# Patient Record
Sex: Female | Born: 1950
Health system: Southern US, Community
[De-identification: ages and names within clinical notes are randomized; demographics above are authoritative.]

## PROBLEM LIST (undated history)

## (undated) DIAGNOSIS — N952 Postmenopausal atrophic vaginitis: Secondary | ICD-10-CM

## (undated) DIAGNOSIS — E663 Overweight: Secondary | ICD-10-CM

## (undated) DIAGNOSIS — R112 Nausea with vomiting, unspecified: Secondary | ICD-10-CM

## (undated) DIAGNOSIS — Z780 Asymptomatic menopausal state: Secondary | ICD-10-CM

## (undated) DIAGNOSIS — J329 Chronic sinusitis, unspecified: Secondary | ICD-10-CM

## (undated) DIAGNOSIS — I10 Essential (primary) hypertension: Secondary | ICD-10-CM

## (undated) DIAGNOSIS — Z9889 Other specified postprocedural states: Secondary | ICD-10-CM

## (undated) HISTORY — DX: Postmenopausal atrophic vaginitis: N95.2

## (undated) HISTORY — DX: Chronic sinusitis, unspecified: J32.9

## (undated) HISTORY — DX: Asymptomatic menopausal state: Z78.0

## (undated) HISTORY — PX: TONSILLECTOMY AND ADENOIDECTOMY: SUR1326

## (undated) HISTORY — DX: Overweight: E66.3

---

## 1989-08-25 HISTORY — PX: TOTAL ABDOMINAL HYSTERECTOMY W/ BILATERAL SALPINGOOPHORECTOMY: SHX83

## 2009-07-06 LAB — HM PAP SMEAR: HM Pap smear: NORMAL

## 2010-03-07 ENCOUNTER — Ambulatory Visit: Payer: Self-pay | Admitting: Gastroenterology

## 2010-06-02 LAB — HM COLONOSCOPY: HM Colonoscopy: NORMAL

## 2011-09-08 ENCOUNTER — Encounter: Payer: Self-pay | Admitting: Internal Medicine

## 2011-09-08 ENCOUNTER — Ambulatory Visit (INDEPENDENT_AMBULATORY_CARE_PROVIDER_SITE_OTHER): Payer: BC Managed Care – PPO | Admitting: Internal Medicine

## 2011-09-08 ENCOUNTER — Ambulatory Visit: Payer: Self-pay | Admitting: Internal Medicine

## 2011-09-08 VITALS — BP 136/82 | HR 73 | Temp 98.1°F | Wt 187.0 lb

## 2011-09-08 DIAGNOSIS — J329 Chronic sinusitis, unspecified: Secondary | ICD-10-CM

## 2011-09-08 MED ORDER — AMOXICILLIN-POT CLAVULANATE 875-125 MG PO TABS: 1.0000 | ORAL_TABLET | Freq: Two times a day (BID) | ORAL | Status: AC

## 2011-09-08 NOTE — Progress Notes (Signed)
  Subjective:    Patient ID: Karen Vaughn, female    DOB: 07-09-1951, 61 y.o.   MRN: [id number]  HPI 61 year old female presents for acute visit complaining of a one-week history of nasal congestion and sinus pressure. She reports that her symptoms first began with fever, chills, malaise, myalgia. These symptoms resolved and she developed gradually worsening nasal congestion and sinus pressure. She describes facial pain across her anterior cheeks and forehead. She has been taking over-the-counter Sudafed and ibuprofen with no improvement.   Review of Systems  Constitutional: Positive for fever, chills and fatigue. Negative for unexpected weight change.  HENT: Positive for congestion, rhinorrhea and sinus pressure. Negative for hearing loss, ear pain, nosebleeds, sore throat, facial swelling, sneezing, mouth sores, trouble swallowing, neck pain, neck stiffness, voice change, postnasal drip, tinnitus and ear discharge.   Eyes: Negative for pain, discharge, redness and visual disturbance.  Respiratory: Positive for cough. Negative for chest tightness, shortness of breath, wheezing and stridor.   Cardiovascular: Negative for chest pain, palpitations and leg swelling.  Musculoskeletal: Negative for myalgias and arthralgias.  Skin: Negative for color change and rash.  Neurological: Negative for dizziness, weakness, light-headedness and headaches.  Hematological: Negative for adenopathy.       Objective:   Physical Exam  Constitutional: She is oriented to person, place, and time. She appears well-developed and well-nourished. No distress.  HENT:  Head: Normocephalic and atraumatic.  Right Ear: External ear normal. A middle ear effusion is present.  Left Ear: External ear normal. A middle ear effusion is present.  Nose: Mucosal edema present.  Mouth/Throat: Oropharynx is clear and moist. No oropharyngeal exudate.  Eyes: Conjunctivae are normal. Pupils are equal, round, and reactive to light.  Right eye exhibits no discharge. Left eye exhibits no discharge. No scleral icterus.  Neck: Normal range of motion. Neck supple. No tracheal deviation present. No thyromegaly present.  Cardiovascular: Normal rate, regular rhythm, normal heart sounds and intact distal pulses.  Exam reveals no gallop and no friction rub.   No murmur heard. Pulmonary/Chest: Effort normal and breath sounds normal. No respiratory distress. She has no wheezes. She has no rales. She exhibits no tenderness.  Musculoskeletal: Normal range of motion. She exhibits no edema and no tenderness.  Lymphadenopathy:    She has no cervical adenopathy.  Neurological: She is alert and oriented to person, place, and time. No cranial nerve deficit. She exhibits normal muscle tone. Coordination normal.  Skin: Skin is warm and dry. No rash noted. She is not diaphoretic. No erythema. No pallor.  Psychiatric: She has a normal mood and affect. Her behavior is normal. Judgment and thought content normal.          Assessment & Plan:  1. Sinusitis - Will treat with augmentin x 10 days. Pt will continue OTC ibuprofen and sudafed prn. Follow up prn.

## 2011-11-05 ENCOUNTER — Encounter: Payer: Self-pay | Admitting: Internal Medicine

## 2012-02-19 ENCOUNTER — Telehealth: Payer: Self-pay | Admitting: Internal Medicine

## 2012-02-19 NOTE — Telephone Encounter (Signed)
Pt spouse came in and pt needs to get a rx for the hep a combo they are going out of the country in oct

## 2012-02-19 NOTE — Telephone Encounter (Signed)
Called patient at work and she is at lunch, will call back later.

## 2012-02-19 NOTE — Telephone Encounter (Signed)
She will need Twinrix - assuming they have NOT had hep B.

## 2012-02-23 MED ORDER — HEPATITIS A-HEP B RECOMB VAC 720-20 ELU-MCG/ML IM SUSP: 1.0000 mL | Freq: Once | INTRAMUSCULAR | Status: DC

## 2012-02-23 NOTE — Telephone Encounter (Signed)
Spoke with patient and she stated that she had titers done and they showed she had no immunity to Hep B.  She would like a Rx for Twinrix sent to [facility] pharmacy.  Rx sent per patients request.

## 2012-06-02 LAB — HM MAMMOGRAPHY: HM MAMMO: NORMAL

## 2013-06-15 ENCOUNTER — Encounter: Payer: Self-pay | Admitting: Adult Health

## 2013-06-15 ENCOUNTER — Ambulatory Visit (INDEPENDENT_AMBULATORY_CARE_PROVIDER_SITE_OTHER): Payer: BC Managed Care – HMO | Admitting: Adult Health

## 2013-06-15 VITALS — BP 142/90 | HR 92 | Temp 97.9°F | Resp 12 | Wt 187.5 lb

## 2013-06-15 DIAGNOSIS — J019 Acute sinusitis, unspecified: Secondary | ICD-10-CM

## 2013-06-15 MED ORDER — FLUTICASONE PROPIONATE 50 MCG/ACT NA SUSP: NASAL | Status: DC

## 2013-06-15 MED ORDER — AMOXICILLIN-POT CLAVULANATE 875-125 MG PO TABS: 1.0000 | ORAL_TABLET | Freq: Two times a day (BID) | ORAL | Status: DC

## 2013-06-15 NOTE — Progress Notes (Signed)
  Subjective:    Patient ID: Karen Vaughn, female    DOB: [birth year], 62 y.o.   MRN: [id number]  HPI  Pt is pleasant 62 yo female, presents to clinic for headache, facial swelling, and left sided sinus pressure with low grade fever for 3 weeks.  Pt states she has been taking Sudafed and Ibuprofen without relief.  Pt states she has had complications with sinusitis progressing to pneumonia in the past.  Pt reports mild nasal drainage, denies cough or chest congestion.  Pt states she has also used Netipot but feels this makes her head hurt worse.  Current Outpatient Prescriptions on File Prior to Visit  Medication Sig Dispense Refill  . omeprazole (PRILOSEC) 20 MG capsule Take 20 mg by mouth daily.       No current facility-administered medications on file prior to visit.     Review of Systems  Constitutional: Positive for fever and chills.  HENT: Positive for congestion, facial swelling, postnasal drip and sinus pressure.   Respiratory: Negative for cough, chest tightness, shortness of breath and wheezing.   Cardiovascular: Negative for chest pain.  Musculoskeletal: Negative for arthralgias and myalgias.  Skin: Negative.  Negative for color change and rash.  Neurological: Positive for headaches.    Past Medical History  Diagnosis Date  . Sinusitis   . Menopause     After TAH, premarin 2-3 years, now occasional hot flashes  . Atrophic vaginitis   . Overweight(278.02)        Objective:   Physical Exam  Constitutional: She is oriented to person, place, and time. She appears well-developed and well-nourished. No distress.  HENT:  Head: Normocephalic.  Nose: Left sinus exhibits maxillary sinus tenderness and frontal sinus tenderness.  Mouth/Throat: Posterior oropharyngeal erythema present.  Ear canal erythema, right worse than left  Eyes: Conjunctivae are normal. Pupils are equal, round, and reactive to light. Right eye exhibits no discharge. Left eye exhibits no discharge.   Neck: Normal range of motion. No thyromegaly present.  Bilateral jugular lymph nodes palpable, non-tender  Cardiovascular: Normal rate, regular rhythm and normal heart sounds.   Pulmonary/Chest: Effort normal and breath sounds normal. No accessory muscle usage. No respiratory distress. She has no wheezes. She has no rales.  Lymphadenopathy:    She has cervical adenopathy.  Neurological: She is alert and oriented to person, place, and time.  Skin: Skin is warm and dry.  Psychiatric: She has a normal mood and affect. Her behavior is normal. Judgment and thought content normal.    BP 142/90  Pulse 92  Temp(Src) 97.9 F (36.6 C) (Oral)  Resp 12  Wt 187 lb 8 oz (85.049 kg)  SpO2 98%       Assessment & Plan:

## 2013-06-15 NOTE — Assessment & Plan Note (Signed)
Start Augmentin twice a day x10 days. Flonase nasal spray 2 sprays into each nostril daily. RTC if symptoms are not improved within 3-4 days.

## 2013-06-15 NOTE — Patient Instructions (Signed)
  Start Augmentin twice daily for 10 days.  Also start flonase 2 sprays into each nostril daily.  Continue supportive care for symptom management.  If no improvement within 4-5 days please call the office.

## 2013-07-04 ENCOUNTER — Ambulatory Visit (INDEPENDENT_AMBULATORY_CARE_PROVIDER_SITE_OTHER): Payer: BC Managed Care – HMO | Admitting: Adult Health

## 2013-07-04 ENCOUNTER — Telehealth: Payer: Self-pay | Admitting: Internal Medicine

## 2013-07-04 ENCOUNTER — Encounter: Payer: Self-pay | Admitting: Adult Health

## 2013-07-04 VITALS — BP 150/98 | HR 80 | Resp 14 | Wt 190.0 lb

## 2013-07-04 DIAGNOSIS — I10 Essential (primary) hypertension: Secondary | ICD-10-CM | POA: Insufficient documentation

## 2013-07-04 DIAGNOSIS — Z79899 Other long term (current) drug therapy: Secondary | ICD-10-CM

## 2013-07-04 MED ORDER — HYDROCHLOROTHIAZIDE 25 MG PO TABS: 25.0000 mg | ORAL_TABLET | Freq: Every day | ORAL | Status: DC

## 2013-07-04 NOTE — Patient Instructions (Signed)
  Start HCTZ 25 mg daily for your blood pressure.  Monitor daily 2 hours after taking your medication and in the morning prior to taking medication.  Return to clinic for labs next week.

## 2013-07-04 NOTE — Progress Notes (Signed)
  Subjective:    Patient ID: Karen Vaughn, female    DOB: 1951-02-09, 62 y.o.   MRN: [id number]  HPI  Patient is a pleasant 62 year old female who presents to clinic with concerns of elevated blood pressure. She was recently seen in clinic for sinus infection. She had been taking Sudafed; however, she reports she has not taken this medication approximately one week. She also reports a headache. She has been taking ibuprofen.  Current Outpatient Prescriptions on File Prior to Visit  Medication Sig Dispense Refill  . fluticasone (FLONASE) 50 MCG/ACT nasal spray 2 sprays into each nostril daily  16 g  6  . omeprazole (PRILOSEC) 20 MG capsule Take 20 mg by mouth daily.      Marland Kitchen amoxicillin-clavulanate (AUGMENTIN) 875-125 MG per tablet Take 1 tablet by mouth 2 (two) times daily.  20 tablet  0  . pseudoephedrine (SUDAFED) 120 MG 12 hr tablet Take 120 mg by mouth every 12 (twelve) hours.       No current facility-administered medications on file prior to visit.     Review of Systems  Constitutional: Negative for fever and chills.  Respiratory: Negative.   Cardiovascular: Negative for chest pain and leg swelling.       Elevated b/p readings.  Neurological: Positive for headaches.       Objective:   Physical Exam  Constitutional: She is oriented to person, place, and time.  Overweight, pleasant female in no apparent distress  Cardiovascular: Normal rate, regular rhythm and normal heart sounds.  Exam reveals no gallop and no friction rub.   No murmur heard. Blood pressure recheck was 150/98  Pulmonary/Chest: Effort normal and breath sounds normal. No respiratory distress. She has no wheezes. She has no rales.  Neurological: She is alert and oriented to person, place, and time.  Psychiatric: She has a normal mood and affect. Her behavior is normal. Judgment and thought content normal.          Assessment & Plan:

## 2013-07-04 NOTE — Telephone Encounter (Signed)
Patient Information:  Caller Name: Marylyn  Phone: [phone]  Patient: Karen Vaughn, Karen Vaughn  Gender: Female  DOB: 01-19-1951  Age: 62 Years  PCP: Ronna Polio (Adults only)  Office Follow Up:  Does the office need to follow up with this patient?: No  Instructions For The Office: N/A  RN Note:  Has had headaches and is newly diagnosed with hypertension.  States she is avoiding foods which would contribute to it, and takes her 140's/90's, but right now her blood pressure 148/108.  Per high pressure protocol, emergent symptoms denied; advised appt within 2 weeks.  Patient wants to be seen as soon as possible.  Appt scheduled 07/04/13 1330 with Ms. Hassie Bruce.  krs/can  Symptoms  Reason For Call & Symptoms: hypertension  Reviewed Health History In EMR: Yes  Reviewed Medications In EMR: Yes  Reviewed Allergies In EMR: Yes  Reviewed Surgeries / Procedures: Yes  Date of Onset of Symptoms: 07/04/2013  Guideline(s) Used:  High Blood Pressure  Disposition Per Guideline:   See Within 2 Weeks in Office  Reason For Disposition Reached:   BP > 140/90 and is not taking BP medications  Advice Given:  N/A  Patient Will Follow Care Advice:  YES  Appointment Scheduled:  07/04/2013 13:30:00 Appointment Scheduled Provider:  Orville Govern

## 2013-07-04 NOTE — Assessment & Plan Note (Signed)
Start HCTZ 25 mg daily. Stop Sudafed. Do not take ibuprofen. Monitor blood pressure 2 hours after taking medication and again in the morning prior to taking medication. Return to clinic in one week for lab check. Call if b/p not well controlled within 2-3 days. Avoid all sodium. Weight loss is highly recommended and will help b/p.

## 2013-07-04 NOTE — Telephone Encounter (Signed)
Pt is calling and say that her b/p is running very high for the bottom number it's been 120/105. She is having bad headaches. I am sending her to triage [facility]se but wanted to send this back as well in case she didn't get a triage nurse.

## 2013-07-04 NOTE — Telephone Encounter (Signed)
FYI.Marland KitchenMarland KitchenSeeing Raquel this afternoon.

## 2013-07-12 ENCOUNTER — Other Ambulatory Visit (INDEPENDENT_AMBULATORY_CARE_PROVIDER_SITE_OTHER): Payer: BC Managed Care – HMO

## 2013-07-12 ENCOUNTER — Ambulatory Visit: Payer: BC Managed Care – HMO | Admitting: Internal Medicine

## 2013-07-12 DIAGNOSIS — Z79899 Other long term (current) drug therapy: Secondary | ICD-10-CM

## 2013-07-12 LAB — BASIC METABOLIC PANEL
Calcium: 10.5 mg/dL (ref 8.4–10.5)
Creatinine, Ser: 0.9 mg/dL (ref 0.4–1.2)

## 2013-11-02 ENCOUNTER — Other Ambulatory Visit: Payer: Self-pay | Admitting: *Deleted

## 2013-11-02 MED ORDER — HYDROCHLOROTHIAZIDE 25 MG PO TABS: 25.0000 mg | ORAL_TABLET | Freq: Every day | ORAL | Status: DC

## 2013-11-02 NOTE — Telephone Encounter (Signed)
Pharmacy called requesting to change Rx to 90 day supply

## 2014-03-06 ENCOUNTER — Ambulatory Visit: Payer: Self-pay

## 2014-03-29 DIAGNOSIS — M23329 Other meniscus derangements, posterior horn of medial meniscus, unspecified knee: Secondary | ICD-10-CM | POA: Diagnosis not present

## 2014-04-26 DIAGNOSIS — H33199 Other retinoschisis and retinal cysts, unspecified eye: Secondary | ICD-10-CM | POA: Diagnosis not present

## 2014-04-26 DIAGNOSIS — H5210 Myopia, unspecified eye: Secondary | ICD-10-CM | POA: Diagnosis not present

## 2014-04-29 ENCOUNTER — Other Ambulatory Visit: Payer: Self-pay | Admitting: Adult Health

## 2014-05-25 DIAGNOSIS — H35372 Puckering of macula, left eye: Secondary | ICD-10-CM | POA: Diagnosis not present

## 2014-06-02 ENCOUNTER — Ambulatory Visit (INDEPENDENT_AMBULATORY_CARE_PROVIDER_SITE_OTHER): Payer: Medicare Other | Admitting: Internal Medicine

## 2014-06-02 ENCOUNTER — Encounter: Payer: Self-pay | Admitting: Internal Medicine

## 2014-06-02 VITALS — BP 130/90 | HR 80 | Temp 98.4°F | Ht 61.25 in | Wt 184.5 lb

## 2014-06-02 DIAGNOSIS — I10 Essential (primary) hypertension: Secondary | ICD-10-CM

## 2014-06-02 DIAGNOSIS — E669 Obesity, unspecified: Secondary | ICD-10-CM | POA: Insufficient documentation

## 2014-06-02 DIAGNOSIS — Z1239 Encounter for other screening for malignant neoplasm of breast: Secondary | ICD-10-CM

## 2014-06-02 DIAGNOSIS — Z0000 Encounter for general adult medical examination without abnormal findings: Secondary | ICD-10-CM | POA: Diagnosis not present

## 2014-06-02 DIAGNOSIS — L659 Nonscarring hair loss, unspecified: Secondary | ICD-10-CM | POA: Diagnosis not present

## 2014-06-02 DIAGNOSIS — Z23 Encounter for immunization: Secondary | ICD-10-CM | POA: Diagnosis not present

## 2014-06-02 DIAGNOSIS — S83207A Unspecified tear of unspecified meniscus, current injury, left knee, initial encounter: Secondary | ICD-10-CM | POA: Insufficient documentation

## 2014-06-02 LAB — COMPREHENSIVE METABOLIC PANEL
ALT: 22 U/L (ref 0–35)
AST: 20 U/L (ref 0–37)
Albumin: 3.7 g/dL (ref 3.5–5.2)
Alkaline Phosphatase: 65 U/L (ref 39–117)
BILIRUBIN TOTAL: 0.6 mg/dL (ref 0.2–1.2)
BUN: 9 mg/dL (ref 6–23)
CO2: 29 meq/L (ref 19–32)
Calcium: 9.5 mg/dL (ref 8.4–10.5)
Chloride: 99 mEq/L (ref 96–112)
Creatinine, Ser: 0.7 mg/dL (ref 0.4–1.2)
GFR: 90.72 mL/min (ref >60.00)
Glucose, Bld: 84 mg/dL (ref 70–99)
Potassium: 3.8 mEq/L (ref 3.5–5.1)
SODIUM: 134 meq/L — AB (ref 135–145)
TOTAL PROTEIN: 7.1 g/dL (ref 6.0–8.3)

## 2014-06-02 LAB — CBC WITH DIFFERENTIAL/PLATELET
Basophils Absolute: 0.1 10*3/uL (ref 0.0–0.1)
Basophils Relative: 0.7 % (ref 0.0–3.0)
Eosinophils Absolute: 0.2 10*3/uL (ref 0.0–0.7)
Eosinophils Relative: 1.8 % (ref 0.0–5.0)
HCT: 42.8 % (ref 36.0–46.0)
Hemoglobin: 14 g/dL (ref 12.0–15.0)
LYMPHS PCT: 27.4 % (ref 12.0–46.0)
Lymphs Abs: 2.4 10*3/uL (ref 0.7–4.0)
MCHC: 32.7 g/dL (ref 30.0–36.0)
MCV: 88.7 fl (ref 78.0–100.0)
MONOS PCT: 9.1 % (ref 3.0–12.0)
Monocytes Absolute: 0.8 10*3/uL (ref 0.1–1.0)
Neutro Abs: 5.3 10*3/uL (ref 1.4–7.7)
Neutrophils Relative %: 61 % (ref 43.0–77.0)
Platelets: 258 10*3/uL (ref 150.0–400.0)
RBC: 4.83 Mil/uL (ref 3.87–5.11)
RDW: 13.3 % (ref 11.5–15.5)
WBC: 8.6 10*3/uL (ref 4.0–10.5)

## 2014-06-02 LAB — LIPID PANEL
CHOL/HDL RATIO: 4
Cholesterol: 194 mg/dL (ref 0–200)
HDL: 50.6 mg/dL (ref >39.00)
LDL Cholesterol: 118 mg/dL — ABNORMAL HIGH (ref 0–99)
NONHDL: 143.4
Triglycerides: 129 mg/dL (ref 0.0–149.0)
VLDL: 25.8 mg/dL (ref 0.0–40.0)

## 2014-06-02 LAB — TSH: TSH: 1.73 u[IU]/mL (ref 0.35–4.50)

## 2014-06-02 LAB — MICROALBUMIN / CREATININE URINE RATIO
Creatinine,U: 18.4 mg/dL
MICROALB UR: 0.2 mg/dL (ref 0.0–1.9)
Microalb Creat Ratio: 1.1 mg/g (ref 0.0–30.0)

## 2014-06-02 MED ORDER — ZOSTER VACCINE LIVE 19400 UNT/0.65ML ~~LOC~~ SOLR: 0.6500 mL | Freq: Once | SUBCUTANEOUS | Status: DC

## 2014-06-02 MED ORDER — HYDROCHLOROTHIAZIDE 25 MG PO TABS: 25.0000 mg | ORAL_TABLET | Freq: Every day | ORAL | Status: DC

## 2014-06-02 NOTE — Addendum Note (Signed)
Addended by: Marchia MeiersEASTWOOD, Symantha Steeber M on: 06/02/2014 03:13 PM   Modules accepted: Orders

## 2014-06-02 NOTE — Progress Notes (Signed)
Pre visit review using our clinic review tool, if applicable. No additional management support is needed unless otherwise documented below in the visit note. 

## 2014-06-02 NOTE — Assessment & Plan Note (Signed)
Wt Readings from Last 3 Encounters:  06/02/14 184 lb 8 oz (83.689 kg)  07/04/13 190 lb (86.183 kg)  06/15/13 187 lb 8 oz (85.049 kg)   Body mass index is 34.57 kg/(m^2). Encouraged healthy diet and exercise with goal of weight loss.

## 2014-06-02 NOTE — Progress Notes (Signed)
The patient is here for annual Medicare Wellness Examination and management of other chronic and acute problems.   The risk factors are reflected in the history.  The roster of all physicians providing medical care to patient - is listed in the Snapshot section of the chart.  Activities of daily living:   The patient is 100% independent in all ADLs: dressing, toileting, feeding as well as independent mobility. Patient lives with husband in home in [place]. Has 3 cats and 1 dog.  Home safety :  The patient has smoke detectors in the home.  They wear seatbelts in their car. There are no firearms at home.  There is no violence in the home. They feel safe where they live.  Infectious Risks: There is no risks for hepatitis, STDs or HIV.  There is no  history of blood transfusion.  They have no travel history to infectious disease endemic areas of the world.  Additional Health Care Providers: The patient has seen their dentist in the last six months. Dentist - Dr. Joesphine BareGreeson They have seen their eye doctor in the last year. Opthalmologist - Dr. Donnetta HutchingEsmail They deny hearing issues. They have deferred audiologic testing in the last year.   They do not  have excessive sun exposure. Discussed the need for sun protection: hats,long sleeves and use of sunscreen if there is significant sun exposure.  Dermatologist - none at present  Diet: the importance of a healthy diet is discussed. They do have a healthy diet. Following a new diet, the Fast Metabolism Diet.  The benefits of regular aerobic exercise were discussed. Exercise has been limited by recent knee injury. Trying to wear brace for more support.  Depression screen: there are no signs or vegative symptoms of depression- irritability, change in appetite, anhedonia, sadness/tearfullness.  Cognitive assessment: the patient manages all their financial and personal affairs and is actively engaged.   HCPOA - planning to set up  The following portions  of the patient's history were reviewed and updated as appropriate: allergies, current medications, past family history, past medical history,  past surgical history, past social history and problem list.  Visual acuity was not assessed per patient preference as they have regular follow up with their ophthalmologist. Hearing and body mass index were assessed and reviewed.   During the course of the visit the patient was educated and counseled about appropriate screening and preventive services including : fall prevention , diabetes screening, nutrition counseling, colorectal cancer screening, and recommended immunizations.    Left lateral meniscal tear - Occurred this spring with twisting injury. Followed by Dr. Magnus IvanBlackman. MRI confirmed tear. Decided not to pursue surgical repair at this point. No currently taking anything for pain. Plans for steroid injection prior to upcoming trip.  Also concerned about hair loss and thinning of eyebrows over last few years.  Review of Systems  Constitutional: Negative for fever, chills, appetite change, fatigue and unexpected weight change.  Eyes: Negative for visual disturbance.  Respiratory: Negative for shortness of breath.   Cardiovascular: Negative for chest pain and leg swelling.  Gastrointestinal: Negative for vomiting, abdominal pain, diarrhea and constipation.  Musculoskeletal: Positive for arthralgias and myalgias.  Skin: Negative for color change and rash.  Hematological: Negative for adenopathy. Does not bruise/bleed easily.  Psychiatric/Behavioral: Negative for sleep disturbance and dysphoric mood. The patient is not nervous/anxious.        Objective:    BP 130/90  Pulse 80  Temp(Src) 98.4 F (36.9 C) (Oral)  Ht 5' 1.25" (  1.556 m)  Wt 184 lb 8 oz (83.689 kg)  BMI 34.57 kg/m2  SpO2 97% Physical Exam  Constitutional: She is oriented to person, place, and time. She appears well-developed and well-nourished. No distress.  HENT:  Head:  Normocephalic and atraumatic.  Right Ear: External ear normal.  Left Ear: External ear normal.  Nose: Nose normal.  Mouth/Throat: Oropharynx is clear and moist. No oropharyngeal exudate.  Eyes: Conjunctivae are normal. Pupils are equal, round, and reactive to light. Right eye exhibits no discharge. Left eye exhibits no discharge. No scleral icterus.  Neck: Normal range of motion. Neck supple. No tracheal deviation present. No thyromegaly present.  Cardiovascular: Normal rate, regular rhythm, normal heart sounds and intact distal pulses.  Exam reveals no gallop and no friction rub.   No murmur heard. Pulmonary/Chest: Effort normal and breath sounds normal. No accessory muscle usage. Not tachypneic. No respiratory distress. She has no decreased breath sounds. She has no wheezes. She has no rhonchi. She has no rales. She exhibits no tenderness. Right breast exhibits no inverted nipple, no mass, no nipple discharge, no skin change and no tenderness. Left breast exhibits no inverted nipple, no mass, no nipple discharge, no skin change and no tenderness. Breasts are symmetrical.  Abdominal: Soft. Bowel sounds are normal. She exhibits no distension and no mass. There is no tenderness. There is no rebound and no guarding.  Musculoskeletal: Normal range of motion. She exhibits no edema and no tenderness.  Lymphadenopathy:    She has no cervical adenopathy.  Neurological: She is alert and oriented to person, place, and time. No cranial nerve deficit. She exhibits normal muscle tone. Coordination normal.  Skin: Skin is warm and dry. No rash noted. She is not diaphoretic. No erythema. No pallor.  Psychiatric: She has a normal mood and affect. Her behavior is normal. Judgment and thought content normal.          Assessment & Plan:   Problem List Items Addressed This Visit     Unprioritized   Acute meniscal tear of left knee     S/p evaluation and MRI with orthopedics, Dr. Magnus IvanBlackman. Will request  report on MRI. Continue supportive brace as needed and follow up with ortho.    Hair loss     Recent hair loss noted by pt. Will check TSH with labs. We discussed that this may be a normal finding.    Relevant Orders      TSH   Medicare annual wellness visit, initial - Primary     General medical exam including breast exam normal today. PAP and pelvic deferred as pt s/p hysterectomy. Colonoscopy UTD. Flu and Prevnar today. Labs today including CBC, CMP, lipids, TSH. Encouraged healthy diet and exercise.    Relevant Medications      zoster vaccine live, PF, (ZOSTAVAX) [id number] UNT/0.65ML injection   Other Relevant Orders      CBC with Differential      Comprehensive metabolic panel      Lipid panel      Microalbumin / creatinine urine ratio   Obesity (BMI 30-39.9)      Wt Readings from Last 3 Encounters:  06/02/14 184 lb 8 oz (83.689 kg)  07/04/13 190 lb (86.183 kg)  06/15/13 187 lb 8 oz (85.049 kg)   Body mass index is 34.57 kg/(m^2). Encouraged healthy diet and exercise with goal of weight loss.    Screening for breast cancer   Relevant Orders      MM Digital Screening  No Follow-up on file.

## 2014-06-02 NOTE — Assessment & Plan Note (Signed)
S/p evaluation and MRI with orthopedics, Dr. Magnus IvanBlackman. Will request report on MRI. Continue supportive brace as needed and follow up with ortho.

## 2014-06-02 NOTE — Assessment & Plan Note (Signed)
General medical exam including breast exam normal today. PAP and pelvic deferred as pt s/p hysterectomy. Colonoscopy UTD. Flu and Prevnar today. Labs today including CBC, CMP, lipids, TSH. Encouraged healthy diet and exercise.

## 2014-06-02 NOTE — Patient Instructions (Signed)

## 2014-06-02 NOTE — Assessment & Plan Note (Signed)
Recent hair loss noted by pt. Will check TSH with labs. We discussed that this may be a normal finding.

## 2014-06-05 ENCOUNTER — Encounter: Payer: Self-pay | Admitting: *Deleted

## 2014-06-15 DIAGNOSIS — M25562 Pain in left knee: Secondary | ICD-10-CM | POA: Diagnosis not present

## 2014-06-15 DIAGNOSIS — M25469 Effusion, unspecified knee: Secondary | ICD-10-CM | POA: Diagnosis not present

## 2014-07-06 DIAGNOSIS — M94262 Chondromalacia, left knee: Secondary | ICD-10-CM | POA: Diagnosis not present

## 2014-07-06 DIAGNOSIS — G8918 Other acute postprocedural pain: Secondary | ICD-10-CM | POA: Diagnosis not present

## 2014-07-06 DIAGNOSIS — M2242 Chondromalacia patellae, left knee: Secondary | ICD-10-CM | POA: Diagnosis not present

## 2014-07-06 DIAGNOSIS — M659 Synovitis and tenosynovitis, unspecified: Secondary | ICD-10-CM | POA: Diagnosis not present

## 2014-07-06 DIAGNOSIS — Y929 Unspecified place or not applicable: Secondary | ICD-10-CM | POA: Diagnosis not present

## 2014-07-06 DIAGNOSIS — M25469 Effusion, unspecified knee: Secondary | ICD-10-CM | POA: Diagnosis not present

## 2014-07-06 DIAGNOSIS — X58XXXA Exposure to other specified factors, initial encounter: Secondary | ICD-10-CM | POA: Diagnosis not present

## 2014-07-06 DIAGNOSIS — S83232A Complex tear of medial meniscus, current injury, left knee, initial encounter: Secondary | ICD-10-CM | POA: Diagnosis not present

## 2014-07-06 DIAGNOSIS — M25562 Pain in left knee: Secondary | ICD-10-CM | POA: Diagnosis not present

## 2014-07-06 HISTORY — PX: KNEE ARTHROSCOPY: SUR90

## 2014-07-11 ENCOUNTER — Encounter: Payer: Self-pay | Admitting: Internal Medicine

## 2014-08-10 DIAGNOSIS — M25561 Pain in right knee: Secondary | ICD-10-CM | POA: Diagnosis not present

## 2014-08-31 DIAGNOSIS — M1712 Unilateral primary osteoarthritis, left knee: Secondary | ICD-10-CM | POA: Diagnosis not present

## 2014-08-31 DIAGNOSIS — H35372 Puckering of macula, left eye: Secondary | ICD-10-CM | POA: Diagnosis not present

## 2014-12-18 DIAGNOSIS — M1711 Unilateral primary osteoarthritis, right knee: Secondary | ICD-10-CM | POA: Diagnosis not present

## 2015-04-11 DIAGNOSIS — M25561 Pain in right knee: Secondary | ICD-10-CM | POA: Diagnosis not present

## 2015-04-11 DIAGNOSIS — M1711 Unilateral primary osteoarthritis, right knee: Secondary | ICD-10-CM | POA: Diagnosis not present

## 2015-04-11 DIAGNOSIS — M1712 Unilateral primary osteoarthritis, left knee: Secondary | ICD-10-CM | POA: Diagnosis not present

## 2015-04-11 DIAGNOSIS — M25462 Effusion, left knee: Secondary | ICD-10-CM | POA: Diagnosis not present

## 2015-05-08 DIAGNOSIS — H524 Presbyopia: Secondary | ICD-10-CM | POA: Diagnosis not present

## 2015-05-08 DIAGNOSIS — H2513 Age-related nuclear cataract, bilateral: Secondary | ICD-10-CM | POA: Diagnosis not present

## 2015-05-08 DIAGNOSIS — H33192 Other retinoschisis and retinal cysts, left eye: Secondary | ICD-10-CM | POA: Diagnosis not present

## 2015-05-08 DIAGNOSIS — H52222 Regular astigmatism, left eye: Secondary | ICD-10-CM | POA: Diagnosis not present

## 2015-05-08 DIAGNOSIS — H5213 Myopia, bilateral: Secondary | ICD-10-CM | POA: Diagnosis not present

## 2015-05-23 DIAGNOSIS — Z23 Encounter for immunization: Secondary | ICD-10-CM | POA: Diagnosis not present

## 2015-07-09 DIAGNOSIS — M25561 Pain in right knee: Secondary | ICD-10-CM | POA: Diagnosis not present

## 2015-07-20 ENCOUNTER — Other Ambulatory Visit: Payer: Self-pay | Admitting: Internal Medicine

## 2015-08-17 ENCOUNTER — Encounter: Payer: Self-pay | Admitting: Internal Medicine

## 2015-09-12 ENCOUNTER — Encounter: Payer: Medicare Other | Admitting: Internal Medicine

## 2015-10-09 ENCOUNTER — Encounter: Payer: Self-pay | Admitting: Internal Medicine

## 2015-10-09 ENCOUNTER — Ambulatory Visit (INDEPENDENT_AMBULATORY_CARE_PROVIDER_SITE_OTHER): Payer: Medicare Other | Admitting: Internal Medicine

## 2015-10-09 VITALS — BP 146/95 | HR 72 | Temp 97.6°F | Ht 62.75 in | Wt 195.2 lb

## 2015-10-09 DIAGNOSIS — I10 Essential (primary) hypertension: Secondary | ICD-10-CM | POA: Diagnosis not present

## 2015-10-09 DIAGNOSIS — M170 Bilateral primary osteoarthritis of knee: Secondary | ICD-10-CM

## 2015-10-09 DIAGNOSIS — M1710 Unilateral primary osteoarthritis, unspecified knee: Secondary | ICD-10-CM | POA: Insufficient documentation

## 2015-10-09 DIAGNOSIS — M179 Osteoarthritis of knee, unspecified: Secondary | ICD-10-CM | POA: Insufficient documentation

## 2015-10-09 DIAGNOSIS — Z0000 Encounter for general adult medical examination without abnormal findings: Secondary | ICD-10-CM

## 2015-10-09 DIAGNOSIS — E669 Obesity, unspecified: Secondary | ICD-10-CM | POA: Diagnosis not present

## 2015-10-09 DIAGNOSIS — Z1239 Encounter for other screening for malignant neoplasm of breast: Secondary | ICD-10-CM | POA: Diagnosis not present

## 2015-10-09 LAB — CBC WITH DIFFERENTIAL/PLATELET
BASOS ABS: 0 10*3/uL (ref 0.0–0.1)
BASOS PCT: 0.6 % (ref 0.0–3.0)
EOS PCT: 2.6 % (ref 0.0–5.0)
Eosinophils Absolute: 0.2 10*3/uL (ref 0.0–0.7)
HEMATOCRIT: 41.3 % (ref 36.0–46.0)
Hemoglobin: 13.9 g/dL (ref 12.0–15.0)
LYMPHS PCT: 30.6 % (ref 12.0–46.0)
Lymphs Abs: 2 10*3/uL (ref 0.7–4.0)
MCHC: 33.7 g/dL (ref 30.0–36.0)
MCV: 86.9 fl (ref 78.0–100.0)
MONOS PCT: 11 % (ref 3.0–12.0)
Monocytes Absolute: 0.7 10*3/uL (ref 0.1–1.0)
NEUTROS ABS: 3.5 10*3/uL (ref 1.4–7.7)
Neutrophils Relative %: 55.2 % (ref 43.0–77.0)
PLATELETS: 258 10*3/uL (ref 150.0–400.0)
RBC: 4.75 Mil/uL (ref 3.87–5.11)
RDW: 13.2 % (ref 11.5–15.5)
WBC: 6.4 10*3/uL (ref 4.0–10.5)

## 2015-10-09 LAB — COMPREHENSIVE METABOLIC PANEL
ALK PHOS: 65 U/L (ref 39–117)
ALT: 21 U/L (ref 0–35)
AST: 20 U/L (ref 0–37)
Albumin: 4.2 g/dL (ref 3.5–5.2)
BILIRUBIN TOTAL: 0.4 mg/dL (ref 0.2–1.2)
BUN: 15 mg/dL (ref 6–23)
CALCIUM: 10 mg/dL (ref 8.4–10.5)
CO2: 31 meq/L (ref 19–32)
Chloride: 100 mEq/L (ref 96–112)
Creatinine, Ser: 0.8 mg/dL (ref 0.40–1.20)
GFR: 76.16 mL/min (ref >60.00)
Glucose, Bld: 101 mg/dL — ABNORMAL HIGH (ref 70–99)
Potassium: 4.1 mEq/L (ref 3.5–5.1)
Sodium: 138 mEq/L (ref 135–145)
Total Protein: 6.8 g/dL (ref 6.0–8.3)

## 2015-10-09 LAB — LIPID PANEL
CHOLESTEROL: 184 mg/dL (ref 0–200)
HDL: 55.3 mg/dL (ref >39.00)
LDL CALC: 99 mg/dL (ref 0–99)
NONHDL: 128.59
Total CHOL/HDL Ratio: 3
Triglycerides: 147 mg/dL (ref 0.0–149.0)
VLDL: 29.4 mg/dL (ref 0.0–40.0)

## 2015-10-09 LAB — MICROALBUMIN / CREATININE URINE RATIO
CREATININE, U: 25.7 mg/dL
Microalb Creat Ratio: 2.7 mg/g (ref 0.0–30.0)

## 2015-10-09 MED ORDER — HYDROCHLOROTHIAZIDE 25 MG PO TABS: 25.0000 mg | ORAL_TABLET | Freq: Every day | ORAL | Status: DC

## 2015-10-09 MED ORDER — CIPROFLOXACIN HCL 500 MG PO TABS: 500.0000 mg | ORAL_TABLET | Freq: Two times a day (BID) | ORAL | Status: DC

## 2015-10-09 MED ORDER — MELOXICAM 15 MG PO TABS: 15.0000 mg | ORAL_TABLET | Freq: Every day | ORAL | Status: DC

## 2015-10-09 MED ORDER — AMLODIPINE BESYLATE 2.5 MG PO TABS: 2.5000 mg | ORAL_TABLET | Freq: Every day | ORAL | Status: DC

## 2015-10-09 NOTE — Assessment & Plan Note (Signed)
BP Readings from Last 3 Encounters:  10/09/15 146/95  06/02/14 130/90  07/04/13 150/98   BP elevated. Will add Amlodipine 2.5mg  daily. Continue HCTZ. Follow up 4 weeks and prn.

## 2015-10-09 NOTE — Assessment & Plan Note (Signed)
OA bilateral knees. Followed by ortho. Considering knee replacement in the future. Will add meloxicam for better pain control. Follow up 4 weeks and prn.

## 2015-10-09 NOTE — Assessment & Plan Note (Signed)
Encouraged healthy diet and exercise

## 2015-10-09 NOTE — Progress Notes (Signed)
Pre visit review using our clinic review tool, if applicable. No additional management support is needed unless otherwise documented below in the visit note. 

## 2015-10-09 NOTE — Progress Notes (Signed)
Subjective:    Patient ID: Karen Vaughn, female    DOB: Mar 26, 1951, 65 y.o.   MRN: [id number]  HPI  The patient is here for annual Medicare Wellness Examination and management of other chronic and acute problems.   The risk factors are reflected in the history.  The roster of all physicians providing medical care to patient - is listed in the Snapshot section of the chart.  Activities of daily living:   The patient is 100% independent in all ADLs: dressing, toileting, feeding as well as independent mobility. Patient lives with husband in home in [place]. Has 2 cats and 1 dog.  Home safety :  The patient has smoke detectors and CO detectors in the home.  They wear seatbelts in their car. There are no firearms at home.  There is no violence in the home. They feel safe where they live.  Infectious Risks: There is no risks for hepatitis, STDs or HIV.  There is no  history of blood transfusion.  They have no travel history to infectious disease endemic areas of the world.  Additional Health Care Providers: The patient has seen their dentist in the last six months. Dentist - Dr. Joesphine Bare They have seen their eye doctor in the last year. Opthalmologist - Dr. Donnetta Hutching They deny hearing issues. They have deferred audiologic testing in the last year.   They do not  have excessive sun exposure. Discussed the need for sun protection: hats,long sleeves and use of sunscreen if there is significant sun exposure.  Dermatologist - none at present  Diet: the importance of a healthy diet is discussed. Working on improving diet.  The benefits of regular aerobic exercise were discussed. Exercise has been limited knee OA.  Depression screen: there are no signs or vegative symptoms of depression- irritability, change in appetite, anhedonia, sadness/tearfullness.  Cognitive assessment: the patient manages all their financial and personal affairs and is actively engaged.   HCPOA - planning to set  up  The following portions of the patient's history were reviewed and updated as appropriate: allergies, current medications, past family history, past medical history,  past surgical history, past social history and problem list.  Visual acuity was not assessed per patient preference as they have regular follow up with their ophthalmologist. Hearing and body mass index were assessed and reviewed.   During the course of the visit the patient was educated and counseled about appropriate screening and preventive services including : fall prevention , diabetes screening, nutrition counseling, colorectal cancer screening, and recommended immunizations.    ACUTE/CHRONIC ISSUES:  Bilateral knee pain - Has "bone on bone arthritis" in both knees. Ortho recommended knee replacement. Taking Ibuprofen as needed for pain. Has had cortisone injections and Synvisc in left knee with some improvement. Uses ice packs for prolonged walking.  HTN - Compliant with HCTZ. No CP, HA. Diastolic 80-90s.  Traveling to [place] next week.  Wt Readings from Last 3 Encounters:  10/09/15 195 lb 4 oz (88.565 kg)  06/02/14 184 lb 8 oz (83.689 kg)  07/04/13 190 lb (86.183 kg)   BP Readings from Last 3 Encounters:  10/09/15 146/95  06/02/14 130/90  07/04/13 150/98    Past Medical History  Diagnosis Date  . Sinusitis   . Menopause     After TAH, premarin 2-3 years, now occasional hot flashes  . Atrophic vaginitis   . Overweight(278.02)    Family History  Problem Relation Age of Onset  . Ovarian cancer Mother   .  Emphysema Father   . Heart disease Maternal Aunt   . Stroke Maternal Aunt   . Breast cancer Paternal Grandmother    Past Surgical History  Procedure Laterality Date  . Total abdominal hysterectomy w/ bilateral salpingoophorectomy  1991  . Tonsillectomy and adenoidectomy    . Knee arthroscopy Left 07/06/14    Dr. Magnus Ivan  torn meniscus    Social History   Social History  . Marital Status:  Married    Spouse Name: N/A  . Number of Children: 0  . Years of Education: N/A   Occupational History  . [employer]    Social History Main Topics  . Smoking status: Never Smoker   . Smokeless tobacco: None  . Alcohol Use: Yes     Comment: Rarely  . Drug Use: No  . Sexual Activity: Not Asked   Other Topics Concern  . None   Social History Narrative    Review of Systems  Constitutional: Negative for fever, chills, appetite change, fatigue and unexpected weight change.  Eyes: Negative for visual disturbance.  Respiratory: Negative for cough, chest tightness and shortness of breath.   Cardiovascular: Negative for chest pain, palpitations and leg swelling.  Gastrointestinal: Negative for nausea, vomiting, abdominal pain, diarrhea and constipation.  Musculoskeletal: Positive for arthralgias and gait problem. Negative for joint swelling.  Skin: Negative for color change and rash.  Hematological: Negative for adenopathy. Does not bruise/bleed easily.  Psychiatric/Behavioral: Negative for sleep disturbance and dysphoric mood. The patient is not nervous/anxious.        Objective:    BP 146/95 mmHg  Pulse 72  Temp(Src) 97.6 F (36.4 C) (Oral)  Ht 5' 2.75" (1.594 m)  Wt 195 lb 4 oz (88.565 kg)  BMI 34.86 kg/m2  SpO2 97% Physical Exam  Constitutional: She is oriented to person, place, and time. She appears well-developed and well-nourished. No distress.  HENT:  Head: Normocephalic and atraumatic.  Right Ear: External ear normal.  Left Ear: External ear normal.  Nose: Nose normal.  Mouth/Throat: Oropharynx is clear and moist. No oropharyngeal exudate.  Eyes: Conjunctivae are normal. Pupils are equal, round, and reactive to light. Right eye exhibits no discharge. Left eye exhibits no discharge. No scleral icterus.  Neck: Normal range of motion. Neck supple. No tracheal deviation present. No thyromegaly present.  Cardiovascular: Normal rate, regular rhythm,  normal heart sounds and intact distal pulses.  Exam reveals no gallop and no friction rub.   No murmur heard. Pulmonary/Chest: Effort normal and breath sounds normal. No accessory muscle usage. No tachypnea. No respiratory distress. She has no decreased breath sounds. She has no wheezes. She has no rales. She exhibits no tenderness. Right breast exhibits no inverted nipple, no mass, no nipple discharge, no skin change and no tenderness. Left breast exhibits no inverted nipple, no mass, no nipple discharge, no skin change and no tenderness. Breasts are symmetrical.  Abdominal: Soft. Bowel sounds are normal. She exhibits no distension and no mass. There is no tenderness. There is no rebound and no guarding.  Musculoskeletal: Normal range of motion. She exhibits no edema or tenderness.  Lymphadenopathy:    She has no cervical adenopathy.  Neurological: She is alert and oriented to person, place, and time. No cranial nerve deficit. She exhibits normal muscle tone. Coordination normal.  Skin: Skin is warm and dry. No rash noted. She is not diaphoretic. No erythema. No pallor.  Psychiatric: She has a normal mood and affect. Her behavior is normal. Judgment  and thought content normal.          Assessment & Plan:   Problem List Items Addressed This Visit      Unprioritized   HTN (hypertension)    BP Readings from Last 3 Encounters:  10/09/15 146/95  06/02/14 130/90  07/04/13 150/98   BP elevated. Will add Amlodipine 2.5mg  daily. Continue HCTZ. Follow up 4 weeks and prn.      Relevant Medications   amLODipine (NORVASC) 2.5 MG tablet   hydrochlorothiazide (HYDRODIURIL) 25 MG tablet   Medicare annual wellness visit, subsequent - Primary    General medical exam including breast exam normal today. Mammogram ordered. Colonoscopy UTD and reviewed. Immunizations UTD. Labs as ordered. Encouraged healthy diet and exercise.      Relevant Orders   CBC with Differential/Platelet   Comprehensive  metabolic panel   Lipid panel   Microalbumin / creatinine urine ratio   Obesity (BMI 30-39.9)    Encouraged healthy diet and exercise.      Osteoarthritis of knee    OA bilateral knees. Followed by ortho. Considering knee replacement in the future. Will add meloxicam for better pain control. Follow up 4 weeks and prn.      Relevant Medications   meloxicam (MOBIC) 15 MG tablet   Screening for breast cancer   Relevant Orders   MM Digital Screening       Return in about 4 weeks (around 11/06/2015) for Recheck of Blood Pressure.

## 2015-10-09 NOTE — Assessment & Plan Note (Signed)
General medical exam including breast exam normal today. Mammogram ordered. Colonoscopy UTD and reviewed. Immunizations UTD. Labs as ordered. Encouraged healthy diet and exercise.

## 2015-10-09 NOTE — Patient Instructions (Addendum)
Start Meloxicam 24m daily as needed for knee pain.  Start Amlodipine 2.561mdaily to control blood pressure.  Health Maintenance, Female Adopting a healthy lifestyle and getting preventive care can go a long way to promote health and wellness. Talk with your health care provider about what schedule of regular examinations is right for you. This is a good chance for you to check in with your provider about disease prevention and staying healthy. In between checkups, there are plenty of things you can do on your own. Experts have done a lot of research about which lifestyle changes and preventive measures are most likely to keep you healthy. Ask your health care provider for more information. WEIGHT AND DIET  Eat a healthy diet  Be sure to include plenty of vegetables, fruits, low-fat dairy products, and lean protein.  Do not eat a lot of foods high in solid fats, added sugars, or salt.  Get regular exercise. This is one of the most important things you can do for your health.  Most adults should exercise for at least 150 minutes each week. The exercise should increase your heart rate and make you sweat (moderate-intensity exercise).  Most adults should also do strengthening exercises at least twice a week. This is in addition to the moderate-intensity exercise.  Maintain a healthy weight  Body mass index (BMI) is a measurement that can be used to identify possible weight problems. It estimates body fat based on height and weight. Your health care provider can help determine your BMI and help you achieve or maintain a healthy weight.  For females 2038ears of age and older:   A BMI below 18.5 is considered underweight.  A BMI of 18.5 to 24.9 is normal.  A BMI of 25 to 29.9 is considered overweight.  A BMI of 30 and above is considered obese.  Watch levels of cholesterol and blood lipids  You should start having your blood tested for lipids and cholesterol at 2084ears of age, then  have this test every 5 years.  You may need to have your cholesterol levels checked more often if:  Your lipid or cholesterol levels are high.  You are older than 5031ears of age.  You are at high risk for heart disease.  CANCER SCREENING   Lung Cancer  Lung cancer screening is recommended for adults 5547037ears old who are at high risk for lung cancer because of a history of smoking.  A yearly low-dose CT scan of the lungs is recommended for people who:  Currently smoke.  Have quit within the past 15 years.  Have at least a 30-pack-year history of smoking. A pack year is smoking an average of one pack of cigarettes a day for 1 year.  Yearly screening should continue until it has been 15 years since you quit.  Yearly screening should stop if you develop a health problem that would prevent you from having lung cancer treatment.  Breast Cancer  Practice breast self-awareness. This means understanding how your breasts normally appear and feel.  It also means doing regular breast self-exams. Let your health care provider know about any changes, no matter how small.  If you are in your 20s or 30s, you should have a clinical breast exam (CBE) by a health care provider every 1-3 years as part of a regular health exam.  If you are 4055r older, have a CBE every year. Also consider having a breast X-ray (mammogram) every year.  If you  have a family history of breast cancer, talk to your health care provider about genetic screening.  If you are at high risk for breast cancer, talk to your health care provider about having an MRI and a mammogram every year.  Breast cancer gene (BRCA) assessment is recommended for women who have family members with BRCA-related cancers. BRCA-related cancers include:  Breast.  Ovarian.  Tubal.  Peritoneal cancers.  Results of the assessment will determine the need for genetic counseling and BRCA1 and BRCA2 testing. Cervical Cancer Your health  care provider may recommend that you be screened regularly for cancer of the pelvic organs (ovaries, uterus, and vagina). This screening involves a pelvic examination, including checking for microscopic changes to the surface of your cervix (Pap test). You may be encouraged to have this screening done every 3 years, beginning at age 54.  For women ages 33-65, health care providers may recommend pelvic exams and Pap testing every 3 years, or they may recommend the Pap and pelvic exam, combined with testing for human papilloma virus (HPV), every 5 years. Some types of HPV increase your risk of cervical cancer. Testing for HPV may also be done on women of any age with unclear Pap test results.  Other health care providers may not recommend any screening for nonpregnant women who are considered low risk for pelvic cancer and who do not have symptoms. Ask your health care provider if a screening pelvic exam is right for you.  If you have had past treatment for cervical cancer or a condition that could lead to cancer, you need Pap tests and screening for cancer for at least 20 years after your treatment. If Pap tests have been discontinued, your risk factors (such as having a new sexual partner) need to be reassessed to determine if screening should resume. Some women have medical problems that increase the chance of getting cervical cancer. In these cases, your health care provider may recommend more frequent screening and Pap tests. Colorectal Cancer  This type of cancer can be detected and often prevented.  Routine colorectal cancer screening usually begins at 67 years of age and continues through 67 years of age.  Your health care provider may recommend screening at an earlier age if you have risk factors for colon cancer.  Your health care provider may also recommend using home test kits to check for hidden blood in the stool.  A small camera at the end of a tube can be used to examine your colon  directly (sigmoidoscopy or colonoscopy). This is done to check for the earliest forms of colorectal cancer.  Routine screening usually begins at age 39.  Direct examination of the colon should be repeated every 5-10 years through 67 years of age. However, you may need to be screened more often if early forms of precancerous polyps or small growths are found. Skin Cancer  Check your skin from head to toe regularly.  Tell your health care provider about any new moles or changes in moles, especially if there is a change in a mole's shape or color.  Also tell your health care provider if you have a mole that is larger than the size of a pencil eraser.  Always use sunscreen. Apply sunscreen liberally and repeatedly throughout the day.  Protect yourself by wearing long sleeves, pants, a wide-brimmed hat, and sunglasses whenever you are outside. HEART DISEASE, DIABETES, AND HIGH BLOOD PRESSURE   High blood pressure causes heart disease and increases the risk of stroke.  High blood pressure is more likely to develop in:  People who have blood pressure in the high end of the normal range (130-139/85-89 mm Hg).  People who are overweight or obese.  People who are African American.  If you are 55-56 years of age, have your blood pressure checked every 3-5 years. If you are 53 years of age or older, have your blood pressure checked every year. You should have your blood pressure measured twice--once when you are at a hospital or clinic, and once when you are not at a hospital or clinic. Record the average of the two measurements. To check your blood pressure when you are not at a hospital or clinic, you can use:  An automated blood pressure machine at a pharmacy.  A home blood pressure monitor.  If you are between 29 years and 91 years old, ask your health care provider if you should take aspirin to prevent strokes.  Have regular diabetes screenings. This involves taking a blood sample to check  your fasting blood sugar level.  If you are at a normal weight and have a low risk for diabetes, have this test once every three years after 67 years of age.  If you are overweight and have a high risk for diabetes, consider being tested at a younger age or more often. PREVENTING INFECTION  Hepatitis B  If you have a higher risk for hepatitis B, you should be screened for this virus. You are considered at high risk for hepatitis B if:  You were born in a country where hepatitis B is common. Ask your health care provider which countries are considered high risk.  Your parents were born in a high-risk country, and you have not been immunized against hepatitis B (hepatitis B vaccine).  You have HIV or AIDS.  You use needles to inject street drugs.  You live with someone who has hepatitis B.  You have had sex with someone who has hepatitis B.  You get hemodialysis treatment.  You take certain medicines for conditions, including cancer, organ transplantation, and autoimmune conditions. Hepatitis C  Blood testing is recommended for:  Everyone born from [birth year] through 1967.  Anyone with known risk factors for hepatitis C. Sexually transmitted infections (STIs)  You should be screened for sexually transmitted infections (STIs) including gonorrhea and chlamydia if:  You are sexually active and are younger than 67 years of age.  You are older than 67 years of age and your health care provider tells you that you are at risk for this type of infection.  Your sexual activity has changed since you were last screened and you are at an increased risk for chlamydia or gonorrhea. Ask your health care provider if you are at risk.  If you do not have HIV, but are at risk, it may be recommended that you take a prescription medicine daily to prevent HIV infection. This is called pre-exposure prophylaxis (PrEP). You are considered at risk if:  You are sexually active and do not regularly use  condoms or know the HIV status of your partner(s).  You take drugs by injection.  You are sexually active with a partner who has HIV. Talk with your health care provider about whether you are at high risk of being infected with HIV. If you choose to begin PrEP, you should first be tested for HIV. You should then be tested every 3 months for as long as you are taking PrEP.  PREGNANCY   If you  are premenopausal and you may become pregnant, ask your health care provider about preconception counseling.  If you may become pregnant, take 400 to 800 micrograms (mcg) of folic acid every day.  If you want to prevent pregnancy, talk to your health care provider about birth control (contraception). OSTEOPOROSIS AND MENOPAUSE   Osteoporosis is a disease in which the bones lose minerals and strength with aging. This can result in serious bone fractures. Your risk for osteoporosis can be identified using a bone density scan.  If you are 42 years of age or older, or if you are at risk for osteoporosis and fractures, ask your health care provider if you should be screened.  Ask your health care provider whether you should take a calcium or vitamin D supplement to lower your risk for osteoporosis.  Menopause may have certain physical symptoms and risks.  Hormone replacement therapy may reduce some of these symptoms and risks. Talk to your health care provider about whether hormone replacement therapy is right for you.  HOME CARE INSTRUCTIONS   Schedule regular health, dental, and eye exams.  Stay current with your immunizations.   Do not use any tobacco products including cigarettes, chewing tobacco, or electronic cigarettes.  If you are pregnant, do not drink alcohol.  If you are breastfeeding, limit how much and how often you drink alcohol.  Limit alcohol intake to no more than 1 drink per day for nonpregnant women. One drink equals 12 ounces of beer, 5 ounces of wine, or 1 ounces of hard  liquor.  Do not use street drugs.  Do not share needles.  Ask your health care provider for help if you need support or information about quitting drugs.  Tell your health care provider if you often feel depressed.  Tell your health care provider if you have ever been abused or do not feel safe at home.   This information is not intended to replace advice given to you by your health care provider. Make sure you discuss any questions you have with your health care provider.   Document Released: 02/24/2011 Document Revised: 09/01/2014 Document Reviewed: 07/13/2013 Elsevier Interactive Patient Education [employer].

## 2015-11-21 DIAGNOSIS — M25561 Pain in right knee: Secondary | ICD-10-CM | POA: Diagnosis not present

## 2015-11-21 DIAGNOSIS — M1711 Unilateral primary osteoarthritis, right knee: Secondary | ICD-10-CM | POA: Diagnosis not present

## 2015-12-03 ENCOUNTER — Other Ambulatory Visit: Payer: Self-pay | Admitting: Physician Assistant

## 2015-12-11 ENCOUNTER — Encounter (HOSPITAL_COMMUNITY)
Admission: RE | Admit: 2015-12-11 | Discharge: 2015-12-11 | Disposition: A | Discharge status: Home / Self Care | Payer: Medicare Other | Source: Ambulatory Visit | Attending: Orthopaedic Surgery | Admitting: Orthopaedic Surgery

## 2015-12-11 ENCOUNTER — Other Ambulatory Visit: Payer: Self-pay

## 2015-12-11 ENCOUNTER — Encounter (HOSPITAL_COMMUNITY): Payer: Self-pay

## 2015-12-11 DIAGNOSIS — Z6830 Body mass index (BMI) 30.0-30.9, adult: Secondary | ICD-10-CM | POA: Diagnosis not present

## 2015-12-11 DIAGNOSIS — I10 Essential (primary) hypertension: Secondary | ICD-10-CM | POA: Diagnosis not present

## 2015-12-11 DIAGNOSIS — M1711 Unilateral primary osteoarthritis, right knee: Secondary | ICD-10-CM | POA: Diagnosis not present

## 2015-12-11 DIAGNOSIS — E669 Obesity, unspecified: Secondary | ICD-10-CM | POA: Diagnosis not present

## 2015-12-11 HISTORY — DX: Essential (primary) hypertension: I10

## 2015-12-11 HISTORY — DX: Nausea with vomiting, unspecified: R11.2

## 2015-12-11 HISTORY — DX: Other specified postprocedural states: Z98.890

## 2015-12-11 LAB — BASIC METABOLIC PANEL
ANION GAP: 11 (ref 5–15)
BUN: 10 mg/dL (ref 6–20)
CO2: 28 mmol/L (ref 22–32)
CREATININE: 0.92 mg/dL (ref 0.44–1.00)
Calcium: 9.5 mg/dL (ref 8.9–10.3)
Chloride: 102 mmol/L (ref 101–111)
GFR calc non Af Amer: >60 mL/min (ref >60)
Glucose, Bld: 125 mg/dL — ABNORMAL HIGH (ref 65–99)
POTASSIUM: 3.5 mmol/L (ref 3.5–5.1)
SODIUM: 141 mmol/L (ref 135–145)

## 2015-12-11 LAB — CBC
HEMATOCRIT: 42.7 % (ref 36.0–46.0)
Hemoglobin: 14 g/dL (ref 12.0–15.0)
MCH: 29 pg (ref 26.0–34.0)
MCHC: 32.8 g/dL (ref 30.0–36.0)
MCV: 88.4 fL (ref 78.0–100.0)
PLATELETS: 252 10*3/uL (ref 150–400)
RBC: 4.83 MIL/uL (ref 3.87–5.11)
RDW: 13.3 % (ref 11.5–15.5)
WBC: 7.9 10*3/uL (ref 4.0–10.5)

## 2015-12-11 LAB — SURGICAL PCR SCREEN
MRSA, PCR: NEGATIVE
STAPHYLOCOCCUS AUREUS: POSITIVE — AB

## 2015-12-11 MED ORDER — CHLORHEXIDINE GLUCONATE 4 % EX LIQD
60.0000 mL | Freq: Once | CUTANEOUS | Status: DC
Start: 2015-12-11 — End: 2015-12-12

## 2015-12-11 NOTE — Pre-Procedure Instructions (Signed)
    Little IshikawaBarbara R Habig  12/11/2015      [id number] [place]- [place], [place] - [address] [place] [address] Phone: [phone] Fax: [phone]  [facility] - [place], [place] - [address] [place] [address] Phone: [phone] Fax: [phone]  [facility] - [place], [place] - [id number] [place] [address] Phone: [phone] Fax: [phone]  [facility] - [place] - [address] [place] [address] Phone: [phone] Fax: [phone]    Your procedure is scheduled on December 13, 2015.  [facility]:30 [facility].M.  Call this number if you have problems the morning of surgery:  [phone]   Remember:  Do not eat food or drink liquids after midnight.  Take these medicines the morning of surgery with A SIP OF WATER : amLODipine (NORVASC), omeprazole (PRILOSEC)   STOP ASPIRIN, NSAID'S meloxicam (MOBIC); ADVIL, ALEVE, IBPROFEN, HERBAL MEDICATIONS   Do not wear jewelry, make-up or nail polish.  Do not wear lotions, powders, or perfumes.  You may wear deodorant.  Do not shave 48 hours prior to surgery.    Do not bring valuables to the hospital.  [facility] is not responsible for any belongings or valuables.  Contacts, dentures or bridgework may not be worn into surgery.  Leave your suitcase in the car.  After surgery it may be brought to your room.  For patients admitted to the hospital, discharge time will be determined by your treatment team.  Patients discharged the day of surgery will not be allowed to drive home.   Name and phone number of your driver:    Special instructions:  "PREPARING FOR SURGERY"  Please read over the following fact sheets that you were given. Pain Booklet, Coughing and Deep Breathing, Blood Transfusion Information, Total Joint Packet and Surgical Site Infection  Prevention

## 2015-12-12 MED ORDER — TRANEXAMIC ACID 1000 MG/10ML IV SOLN
1000.0000 mg | INTRAVENOUS | Status: AC
  Administered 2015-12-13: 1000 mg via INTRAVENOUS
  Filled 2015-12-12: qty 10

## 2015-12-13 ENCOUNTER — Encounter (HOSPITAL_COMMUNITY): Payer: Self-pay | Admitting: Surgery

## 2015-12-13 ENCOUNTER — Inpatient Hospital Stay (HOSPITAL_COMMUNITY): Payer: Medicare Other | Admitting: Anesthesiology

## 2015-12-13 ENCOUNTER — Inpatient Hospital Stay (HOSPITAL_COMMUNITY): Payer: Medicare Other

## 2015-12-13 ENCOUNTER — Inpatient Hospital Stay (HOSPITAL_COMMUNITY): Payer: Medicare Other | Admitting: Emergency Medicine

## 2015-12-13 ENCOUNTER — Encounter (HOSPITAL_COMMUNITY)
Admission: RE | Disposition: A | Discharge status: Home Health | Payer: Self-pay | Source: Ambulatory Visit | Attending: Orthopaedic Surgery

## 2015-12-13 ENCOUNTER — Inpatient Hospital Stay (HOSPITAL_COMMUNITY)
Admission: RE | Admit: 2015-12-13 | Discharge: 2015-12-15 | DRG: 470 | Disposition: A | Discharge status: Home Health | Payer: Medicare Other | Source: Ambulatory Visit | Attending: Orthopaedic Surgery | Admitting: Orthopaedic Surgery

## 2015-12-13 DIAGNOSIS — M25461 Effusion, right knee: Secondary | ICD-10-CM | POA: Diagnosis present

## 2015-12-13 DIAGNOSIS — Z7982 Long term (current) use of aspirin: Secondary | ICD-10-CM

## 2015-12-13 DIAGNOSIS — M1711 Unilateral primary osteoarthritis, right knee: Secondary | ICD-10-CM | POA: Diagnosis not present

## 2015-12-13 DIAGNOSIS — Z6830 Body mass index (BMI) 30.0-30.9, adult: Secondary | ICD-10-CM

## 2015-12-13 DIAGNOSIS — M179 Osteoarthritis of knee, unspecified: Secondary | ICD-10-CM | POA: Diagnosis not present

## 2015-12-13 DIAGNOSIS — Z01812 Encounter for preprocedural laboratory examination: Secondary | ICD-10-CM | POA: Diagnosis not present

## 2015-12-13 DIAGNOSIS — M25561 Pain in right knee: Secondary | ICD-10-CM | POA: Diagnosis not present

## 2015-12-13 DIAGNOSIS — Z96651 Presence of right artificial knee joint: Secondary | ICD-10-CM

## 2015-12-13 DIAGNOSIS — I10 Essential (primary) hypertension: Secondary | ICD-10-CM | POA: Diagnosis present

## 2015-12-13 DIAGNOSIS — E669 Obesity, unspecified: Secondary | ICD-10-CM | POA: Diagnosis not present

## 2015-12-13 DIAGNOSIS — Z471 Aftercare following joint replacement surgery: Secondary | ICD-10-CM | POA: Diagnosis not present

## 2015-12-13 HISTORY — PX: TOTAL KNEE ARTHROPLASTY: SHX125

## 2015-12-13 SURGERY — ARTHROPLASTY, KNEE, TOTAL
Anesthesia: Spinal | Site: Knee | Laterality: Right

## 2015-12-13 MED ORDER — METHOCARBAMOL 500 MG PO TABS
500.0000 mg | ORAL_TABLET | Freq: Four times a day (QID) | ORAL | Status: DC | PRN
  Administered 2015-12-14 – 2015-12-15 (×4): 500 mg via ORAL
  Filled 2015-12-13 (×4): qty 1

## 2015-12-13 MED ORDER — OXYCODONE HCL 5 MG PO TABS
5.0000 mg | ORAL_TABLET | ORAL | Status: DC | PRN
  Administered 2015-12-13 – 2015-12-14 (×4): 10 mg via ORAL
  Filled 2015-12-13 (×3): qty 2

## 2015-12-13 MED ORDER — METOCLOPRAMIDE HCL 5 MG/ML IJ SOLN: 5.0000 mg | Freq: Three times a day (TID) | INTRAMUSCULAR | Status: DC | PRN

## 2015-12-13 MED ORDER — ONDANSETRON HCL 4 MG PO TABS: 4.0000 mg | ORAL_TABLET | Freq: Four times a day (QID) | ORAL | Status: DC | PRN

## 2015-12-13 MED ORDER — CEFAZOLIN SODIUM-DEXTROSE 2-4 GM/100ML-% IV SOLN
2.0000 g | INTRAVENOUS | Status: AC
  Administered 2015-12-13: 2 g via INTRAVENOUS

## 2015-12-13 MED ORDER — ACETAMINOPHEN 325 MG PO TABS
650.0000 mg | ORAL_TABLET | Freq: Four times a day (QID) | ORAL | Status: DC | PRN
  Administered 2015-12-14 – 2015-12-15 (×3): 650 mg via ORAL
  Filled 2015-12-13 (×3): qty 2

## 2015-12-13 MED ORDER — SODIUM CHLORIDE 0.9 % IV SOLN: INTRAVENOUS | Status: DC

## 2015-12-13 MED ORDER — LIDOCAINE HCL (CARDIAC) 20 MG/ML IV SOLN
INTRAVENOUS | Status: DC | PRN
  Administered 2015-12-13: 50 mg via INTRATRACHEAL

## 2015-12-13 MED ORDER — METOCLOPRAMIDE HCL 5 MG PO TABS: 5.0000 mg | ORAL_TABLET | Freq: Three times a day (TID) | ORAL | Status: DC | PRN

## 2015-12-13 MED ORDER — CEFAZOLIN SODIUM-DEXTROSE 2-4 GM/100ML-% IV SOLN
INTRAVENOUS | Status: AC
  Filled 2015-12-13: qty 100

## 2015-12-13 MED ORDER — PANTOPRAZOLE SODIUM 40 MG PO TBEC
40.0000 mg | DELAYED_RELEASE_TABLET | Freq: Every day | ORAL | Status: DC
  Administered 2015-12-14: 40 mg via ORAL
  Filled 2015-12-13: qty 1

## 2015-12-13 MED ORDER — FENTANYL CITRATE (PF) 250 MCG/5ML IJ SOLN
INTRAMUSCULAR | Status: AC
  Filled 2015-12-13: qty 5

## 2015-12-13 MED ORDER — RIVAROXABAN 10 MG PO TABS
10.0000 mg | ORAL_TABLET | Freq: Every day | ORAL | Status: DC
  Administered 2015-12-14 – 2015-12-15 (×2): 10 mg via ORAL
  Filled 2015-12-13 (×2): qty 1

## 2015-12-13 MED ORDER — HYDROMORPHONE HCL 1 MG/ML IJ SOLN
INTRAMUSCULAR | Status: AC
  Filled 2015-12-13: qty 1

## 2015-12-13 MED ORDER — GLYCOPYRROLATE 0.2 MG/ML IJ SOLN
INTRAMUSCULAR | Status: AC
  Filled 2015-12-13: qty 1

## 2015-12-13 MED ORDER — ONDANSETRON HCL 4 MG/2ML IJ SOLN
INTRAMUSCULAR | Status: DC | PRN
  Administered 2015-12-13: 4 mg via INTRAVENOUS

## 2015-12-13 MED ORDER — FENTANYL CITRATE (PF) 250 MCG/5ML IJ SOLN
INTRAMUSCULAR | Status: DC | PRN
Start: 2015-12-13 — End: 2015-12-13
  Administered 2015-12-13: 25 ug via INTRAVENOUS
  Administered 2015-12-13: 50 ug via INTRAVENOUS
  Administered 2015-12-13 (×3): 25 ug via INTRAVENOUS
  Administered 2015-12-13 (×2): 50 ug via INTRAVENOUS

## 2015-12-13 MED ORDER — GLYCOPYRROLATE 0.2 MG/ML IJ SOLN
INTRAMUSCULAR | Status: DC | PRN
  Administered 2015-12-13: 0.2 mg via INTRAVENOUS

## 2015-12-13 MED ORDER — BUPIVACAINE IN DEXTROSE 0.75-8.25 % IT SOLN
INTRATHECAL | Status: DC | PRN
  Administered 2015-12-13: 1.8 mL via INTRATHECAL

## 2015-12-13 MED ORDER — SODIUM CHLORIDE 0.9 % IR SOLN
Status: DC | PRN
  Administered 2015-12-13 (×2): 1000 mL

## 2015-12-13 MED ORDER — ONDANSETRON HCL 4 MG/2ML IJ SOLN
4.0000 mg | Freq: Four times a day (QID) | INTRAMUSCULAR | Status: DC | PRN
  Administered 2015-12-13 – 2015-12-14 (×2): 4 mg via INTRAVENOUS
  Filled 2015-12-13 (×2): qty 2

## 2015-12-13 MED ORDER — DIPHENHYDRAMINE HCL 12.5 MG/5ML PO ELIX: 12.5000 mg | ORAL_SOLUTION | ORAL | Status: DC | PRN

## 2015-12-13 MED ORDER — LACTATED RINGERS IV SOLN
INTRAVENOUS | Status: DC
  Administered 2015-12-13 (×2): via INTRAVENOUS

## 2015-12-13 MED ORDER — ONDANSETRON HCL 4 MG/2ML IJ SOLN
INTRAMUSCULAR | Status: AC
  Filled 2015-12-13: qty 2

## 2015-12-13 MED ORDER — 0.9 % SODIUM CHLORIDE (POUR BTL) OPTIME
TOPICAL | Status: DC | PRN
  Administered 2015-12-13: 1000 mL

## 2015-12-13 MED ORDER — ASPIRIN EC 81 MG PO TBEC
81.0000 mg | DELAYED_RELEASE_TABLET | Freq: Every day | ORAL | Status: DC
  Administered 2015-12-13 – 2015-12-14 (×2): 81 mg via ORAL
  Filled 2015-12-13 (×2): qty 1

## 2015-12-13 MED ORDER — OXYCODONE HCL 5 MG PO TABS
ORAL_TABLET | ORAL | Status: AC
  Filled 2015-12-13: qty 2

## 2015-12-13 MED ORDER — AMLODIPINE BESYLATE 2.5 MG PO TABS
2.5000 mg | ORAL_TABLET | Freq: Every day | ORAL | Status: DC
  Administered 2015-12-14: 2.5 mg via ORAL
  Filled 2015-12-13: qty 1

## 2015-12-13 MED ORDER — METHOCARBAMOL 500 MG PO TABS
ORAL_TABLET | ORAL | Status: AC
  Filled 2015-12-13: qty 1

## 2015-12-13 MED ORDER — HYDROMORPHONE HCL 1 MG/ML IJ SOLN
0.2500 mg | INTRAMUSCULAR | Status: DC | PRN
  Administered 2015-12-13 (×2): 0.5 mg via INTRAVENOUS

## 2015-12-13 MED ORDER — HYDROMORPHONE HCL 1 MG/ML IJ SOLN
1.0000 mg | INTRAMUSCULAR | Status: DC | PRN
  Administered 2015-12-13 – 2015-12-14 (×5): 1 mg via INTRAVENOUS
  Filled 2015-12-13 (×4): qty 1

## 2015-12-13 MED ORDER — MIDAZOLAM HCL 2 MG/2ML IJ SOLN
INTRAMUSCULAR | Status: AC
  Filled 2015-12-13: qty 2

## 2015-12-13 MED ORDER — ALUM & MAG HYDROXIDE-SIMETH 200-200-20 MG/5ML PO SUSP: 30.0000 mL | ORAL | Status: DC | PRN

## 2015-12-13 MED ORDER — PROPOFOL 500 MG/50ML IV EMUL
INTRAVENOUS | Status: DC | PRN
  Administered 2015-12-13: 25 ug/kg/min via INTRAVENOUS

## 2015-12-13 MED ORDER — DOCUSATE SODIUM 100 MG PO CAPS
100.0000 mg | ORAL_CAPSULE | Freq: Two times a day (BID) | ORAL | Status: DC
  Administered 2015-12-14 (×2): 100 mg via ORAL
  Filled 2015-12-13 (×2): qty 1

## 2015-12-13 MED ORDER — HYDROCHLOROTHIAZIDE 25 MG PO TABS
25.0000 mg | ORAL_TABLET | Freq: Every day | ORAL | Status: DC
  Administered 2015-12-13 – 2015-12-14 (×2): 25 mg via ORAL
  Filled 2015-12-13 (×2): qty 1

## 2015-12-13 MED ORDER — PROPOFOL 1000 MG/100ML IV EMUL
INTRAVENOUS | Status: AC
  Filled 2015-12-13: qty 100

## 2015-12-13 MED ORDER — DEXTROSE 5 % IV SOLN
500.0000 mg | Freq: Four times a day (QID) | INTRAVENOUS | Status: DC | PRN
  Administered 2015-12-13: 500 mg via INTRAVENOUS
  Filled 2015-12-13 (×5): qty 5

## 2015-12-13 MED ORDER — MIDAZOLAM HCL 2 MG/2ML IJ SOLN
INTRAMUSCULAR | Status: DC | PRN
  Administered 2015-12-13: 2 mg via INTRAVENOUS

## 2015-12-13 MED ORDER — PROPOFOL 10 MG/ML IV BOLUS
INTRAVENOUS | Status: DC | PRN
  Administered 2015-12-13 (×3): 20 mg via INTRAVENOUS

## 2015-12-13 MED ORDER — POLYETHYLENE GLYCOL 3350 17 G PO PACK: 17.0000 g | PACK | Freq: Every day | ORAL | Status: DC | PRN

## 2015-12-13 MED ORDER — MENTHOL 3 MG MT LOZG: 1.0000 | LOZENGE | OROMUCOSAL | Status: DC | PRN

## 2015-12-13 MED ORDER — CEFAZOLIN SODIUM 1-5 GM-% IV SOLN
1.0000 g | Freq: Four times a day (QID) | INTRAVENOUS | Status: AC
  Administered 2015-12-13 – 2015-12-14 (×2): 1 g via INTRAVENOUS
  Filled 2015-12-13 (×3): qty 50

## 2015-12-13 MED ORDER — PHENOL 1.4 % MT LIQD: 1.0000 | OROMUCOSAL | Status: DC | PRN

## 2015-12-13 MED ORDER — ACETAMINOPHEN 650 MG RE SUPP: 650.0000 mg | Freq: Four times a day (QID) | RECTAL | Status: DC | PRN

## 2015-12-13 SURGICAL SUPPLY — 72 items
BANDAGE ACE 4X5 VEL STRL LF (GAUZE/BANDAGES/DRESSINGS) ×2 IMPLANT
BANDAGE ACE 6X5 VEL STRL LF (GAUZE/BANDAGES/DRESSINGS) ×2 IMPLANT
BANDAGE ELASTIC 6 VELCRO ST LF (GAUZE/BANDAGES/DRESSINGS) ×4 IMPLANT
BANDAGE ESMARK 6X9 LF (GAUZE/BANDAGES/DRESSINGS) ×1 IMPLANT
BLADE SAG 18X100X1.27 (BLADE) ×2 IMPLANT
BNDG ESMARK 6X9 LF (GAUZE/BANDAGES/DRESSINGS) ×2
BOWL SMART MIX CTS (DISPOSABLE) ×2 IMPLANT
CAPT KNEE TOTAL 3 ×2 IMPLANT
CEMENT BONE SIMPLEX SPEEDSET (Cement) ×4 IMPLANT
COVER SURGICAL LIGHT HANDLE (MISCELLANEOUS) ×2 IMPLANT
CUFF TOURNIQUET SINGLE 34IN LL (TOURNIQUET CUFF) ×2 IMPLANT
CUFF TOURNIQUET SINGLE 44IN (TOURNIQUET CUFF) IMPLANT
DRAPE INCISE IOBAN 66X45 STRL (DRAPES) IMPLANT
DRAPE ORTHO SPLIT 77X108 STRL (DRAPES) ×2
DRAPE PROXIMA HALF (DRAPES) ×2 IMPLANT
DRAPE SURG ORHT 6 SPLT 77X108 (DRAPES) ×2 IMPLANT
DRAPE U-SHAPE 47X51 STRL (DRAPES) ×2 IMPLANT
DRSG PAD ABDOMINAL 8X10 ST (GAUZE/BANDAGES/DRESSINGS) ×2 IMPLANT
DURAPREP 26ML APPLICATOR (WOUND CARE) ×2 IMPLANT
ELECT CAUTERY BLADE 6.4 (BLADE) ×2 IMPLANT
ELECT REM PT RETURN 9FT ADLT (ELECTROSURGICAL) ×2
ELECTRODE REM PT RTRN 9FT ADLT (ELECTROSURGICAL) ×1 IMPLANT
EVACUATOR 1/8 PVC DRAIN (DRAIN) IMPLANT
FACESHIELD WRAPAROUND (MASK) ×6 IMPLANT
GAUZE SPONGE 4X4 12PLY STRL (GAUZE/BANDAGES/DRESSINGS) ×2 IMPLANT
GAUZE XEROFORM 1X8 LF (GAUZE/BANDAGES/DRESSINGS) ×2 IMPLANT
GLOVE BIOGEL PI IND STRL 8 (GLOVE) ×2 IMPLANT
GLOVE BIOGEL PI INDICATOR 8 (GLOVE) ×2
GLOVE ECLIPSE 6.5 STRL STRAW (GLOVE) ×2 IMPLANT
GLOVE ORTHO TXT STRL SZ7.5 (GLOVE) ×2 IMPLANT
GLOVE SURG ORTHO 8.0 STRL STRW (GLOVE) ×2 IMPLANT
GOWN STRL REUS W/ TWL LRG LVL3 (GOWN DISPOSABLE) ×2 IMPLANT
GOWN STRL REUS W/ TWL XL LVL3 (GOWN DISPOSABLE) ×2 IMPLANT
GOWN STRL REUS W/TWL LRG LVL3 (GOWN DISPOSABLE) ×2
GOWN STRL REUS W/TWL XL LVL3 (GOWN DISPOSABLE) ×2
HANDPIECE INTERPULSE COAX TIP (DISPOSABLE) ×1
IMMOBILIZER KNEE 22 UNIV (SOFTGOODS) ×2 IMPLANT
KIT BASIN OR (CUSTOM PROCEDURE TRAY) ×2 IMPLANT
KIT ROOM TURNOVER OR (KITS) ×2 IMPLANT
MANIFOLD NEPTUNE II (INSTRUMENTS) ×2 IMPLANT
NDL SAFETY ECLIPSE 18X1.5 (NEEDLE) IMPLANT
NEEDLE HYPO 18GX1.5 SHARP (NEEDLE)
NS IRRIG 1000ML POUR BTL (IV SOLUTION) ×2 IMPLANT
PACK TOTAL JOINT (CUSTOM PROCEDURE TRAY) ×2 IMPLANT
PACK UNIVERSAL I (CUSTOM PROCEDURE TRAY) ×2 IMPLANT
PAD ARMBOARD 7.5X6 YLW CONV (MISCELLANEOUS) ×2 IMPLANT
PAD CAST 4YDX4 CTTN HI CHSV (CAST SUPPLIES) ×1 IMPLANT
PADDING CAST ABS 6INX4YD NS (CAST SUPPLIES) ×1
PADDING CAST ABS COTTON 6X4 NS (CAST SUPPLIES) ×1 IMPLANT
PADDING CAST COTTON 4X4 STRL (CAST SUPPLIES) ×1
PADDING CAST COTTON 6X4 STRL (CAST SUPPLIES) ×2 IMPLANT
SET HNDPC FAN SPRY TIP SCT (DISPOSABLE) ×1 IMPLANT
SET PAD KNEE POSITIONER (MISCELLANEOUS) ×2 IMPLANT
SPONGE LAP 18X18 X RAY DECT (DISPOSABLE) ×2 IMPLANT
STAPLER VISISTAT 35W (STAPLE) IMPLANT
STRIP CLOSURE SKIN 1/2X4 (GAUZE/BANDAGES/DRESSINGS) IMPLANT
SUCTION FRAZIER HANDLE 10FR (MISCELLANEOUS) ×1
SUCTION TUBE FRAZIER 10FR DISP (MISCELLANEOUS) ×1 IMPLANT
SUT MNCRL AB 4-0 PS2 18 (SUTURE) IMPLANT
SUT VIC AB 0 CT1 27 (SUTURE) ×1
SUT VIC AB 0 CT1 27XBRD ANBCTR (SUTURE) ×1 IMPLANT
SUT VIC AB 1 CT1 27 (SUTURE) ×3
SUT VIC AB 1 CT1 27XBRD ANBCTR (SUTURE) ×3 IMPLANT
SUT VIC AB 2-0 CT1 27 (SUTURE) ×3
SUT VIC AB 2-0 CT1 TAPERPNT 27 (SUTURE) ×3 IMPLANT
SYR 50ML LL SCALE MARK (SYRINGE) IMPLANT
TOWEL OR 17X24 6PK STRL BLUE (TOWEL DISPOSABLE) ×2 IMPLANT
TOWEL OR 17X26 10 PK STRL BLUE (TOWEL DISPOSABLE) ×2 IMPLANT
TRAY CATH 16FR W/PLASTIC CATH (SET/KITS/TRAYS/PACK) IMPLANT
TRAY FOLEY CATH 16FRSI W/METER (SET/KITS/TRAYS/PACK) IMPLANT
WATER STERILE IRR 1000ML POUR (IV SOLUTION) ×4 IMPLANT
WRAP KNEE MAXI GEL POST OP (GAUZE/BANDAGES/DRESSINGS) ×2 IMPLANT

## 2015-12-13 NOTE — Progress Notes (Signed)
Orthopedic Tech Progress Note Patient Details:  Karen IshikawaBarbara R Vaughn [birth year] [id number]  CPM Right Knee CPM Right Knee: On Right Knee Flexion (Degrees): 9 Right Knee Extension (Degrees): 0 Additional Comments: trapeze bar patient helper Viewed order from doctor's order list  Nikki DomCrawford, Roderick Calo 12/13/2015, 2:51 PM

## 2015-12-13 NOTE — Anesthesia Procedure Notes (Signed)
Spinal Patient location during procedure: OR Staffing Performed by: anesthesiologist  Preanesthetic Checklist Completed: patient identified, site marked, surgical consent, pre-op evaluation, timeout performed, IV checked, risks and benefits discussed and monitors and equipment checked Spinal Block Patient position: sitting Prep: Betadine Patient monitoring: heart rate, continuous pulse ox and blood pressure Injection technique: single-shot Needle Needle type: Sprotte  Needle gauge: 24 G Needle length: 9 cm Additional Notes Expiration date of kit checked and confirmed. Patient tolerated procedure well, without complications.

## 2015-12-13 NOTE — H&P (Signed)
TOTAL KNEE ADMISSION H&P  Patient is being admitted for right total knee arthroplasty.  Subjective:  Chief Complaint:right knee pain.  HPI: Karen Vaughn, 65 y.o. female, has a history of pain and functional disability in the right knee due to arthritis and has failed non-surgical conservative treatments for greater than 12 weeks to includeNSAID's and/or analgesics, corticosteriod injections, viscosupplementation injections, flexibility and strengthening excercises, weight reduction as appropriate and activity modification.  Onset of symptoms was gradual, starting 5 years ago with gradually worsening course since that time. The patient noted no past surgery on the right knee(s).  Patient currently rates pain in the right knee(s) at 9 out of 10 with activity. Patient has night pain, worsening of pain with activity and weight bearing, pain that interferes with activities of daily living, pain with passive range of motion, crepitus and joint swelling.  Patient has evidence of subchondral sclerosis, periarticular osteophytes and joint space narrowing by imaging studies. There is no active infection.  Patient Active Problem List   Diagnosis Date Noted  . Osteoarthritis of right knee 12/13/2015  . Osteoarthritis of knee 10/09/2015  . Medicare annual wellness visit, subsequent 06/02/2014  . Acute meniscal tear of left knee 06/02/2014  . Obesity (BMI 30-39.9) 06/02/2014  . Hair loss 06/02/2014  . Screening for breast cancer 06/02/2014  . HTN (hypertension) 07/04/2013   Past Medical History  Diagnosis Date  . Sinusitis   . Menopause     After TAH, premarin 2-3 years, now occasional hot flashes  . Atrophic vaginitis   . Overweight(278.02)   . PONV (postoperative nausea and vomiting)   . Hypertension     Past Surgical History  Procedure Laterality Date  . Total abdominal hysterectomy w/ bilateral salpingoophorectomy  1991  . Tonsillectomy and adenoidectomy    . Knee arthroscopy Left  07/06/14    Dr. Magnus IvanBlackman  torn meniscus     Prescriptions prior to admission  Medication Sig Dispense Refill Last Dose  . amLODipine (NORVASC) 2.5 MG tablet Take 1 tablet (2.5 mg total) by mouth daily. 90 tablet 3 12/13/2015 at 0700  . aspirin 81 MG tablet Take 81 mg by mouth daily.   Past Week at Unknown time  . calcium carbonate (TUMS - DOSED IN MG ELEMENTAL CALCIUM) 500 MG chewable tablet Chew 1-4 tablets by mouth at bedtime as needed for indigestion or heartburn.   12/12/2015 at Unknown time  . docusate sodium (COLACE) 100 MG capsule Take 100 mg by mouth daily.   12/12/2015 at Unknown time  . hydrochlorothiazide (HYDRODIURIL) 25 MG tablet Take 1 tablet (25 mg total) by mouth daily. 90 tablet 3 12/12/2015 at Unknown time  . Melatonin 3 MG TABS Take 9 mg by mouth at bedtime.   12/12/2015 at Unknown time  . Multiple Minerals (CALCIUM-MAGNESIUM-ZINC) TABS Take 4 tablets by mouth daily.   12/12/2015 at Unknown time  . omeprazole (PRILOSEC) 20 MG capsule Take 20 mg by mouth daily.   12/13/2015 at 0700  . Pediatric Multivitamins-Fl (POLYVITES/FLUORIDE PO) Take 4 tablets by mouth daily. Type O   12/12/2015 at Unknown time   No Active Allergies  Social History  Substance Use Topics  . Smoking status: Never Smoker   . Smokeless tobacco: Not on file  . Alcohol Use: Yes     Comment: Rarely    Family History  Problem Relation Age of Onset  . Ovarian cancer Mother   . Emphysema Father   . Heart disease Maternal Aunt   . Stroke Maternal  Aunt   . Breast cancer Paternal Grandmother      Review of Systems  Musculoskeletal: Positive for joint pain.  All other systems reviewed and are negative.   Objective:  Physical Exam  Constitutional: She is oriented to person, place, and time. She appears well-developed and well-nourished.  HENT:  Head: Normocephalic and atraumatic.  Eyes: EOM are normal. Pupils are equal, round, and reactive to light.  Neck: Normal range of motion. Neck supple.   Cardiovascular: Normal rate and regular rhythm.   Respiratory: Effort normal and breath sounds normal.  GI: Soft. Bowel sounds are normal.  Musculoskeletal:       Right knee: She exhibits decreased range of motion, swelling and effusion. Tenderness found. Medial joint line and lateral joint line tenderness noted.  Neurological: She is alert and oriented to person, place, and time.  Skin: Skin is warm and dry.  Psychiatric: She has a normal mood and affect.    Vital signs in last 24 hours: Temp:  [97.6 F (36.4 C)] 97.6 F (36.4 C) (04/20 0834) Pulse Rate:  [71] 71 (04/20 0834) Resp:  [20] 20 (04/20 0834) BP: (137)/(82) 137/82 mmHg (04/20 0834) SpO2:  [99 %] 99 % (04/20 0834) Weight:  [88.905 kg (196 lb)] 88.905 kg (196 lb) (04/20 0834)  Labs:   Estimated body mass index is 34.73 kg/(m^2) as calculated from the following:   Height as of this encounter:  (1.6 m).   Weight as of this encounter: 88.905 kg (196 lb).   Imaging Review Plain radiographs demonstrate severe degenerative joint disease of the right knee(s). The overall alignment ismild varus. The bone quality appears to be good for age and reported activity level.  Assessment/Plan:  End stage arthritis, right knee   The patient history, physical examination, clinical judgment of the provider and imaging studies are consistent with end stage degenerative joint disease of the right knee(s) and total knee arthroplasty is deemed medically necessary. The treatment options including medical management, injection therapy arthroscopy and arthroplasty were discussed at length. The risks and benefits of total knee arthroplasty were presented and reviewed. The risks due to aseptic loosening, infection, stiffness, patella tracking problems, thromboembolic complications and other imponderables were discussed. The patient acknowledged the explanation, agreed to proceed with the plan and consent was signed. Patient is being admitted  for inpatient treatment for surgery, pain control, PT, OT, prophylactic antibiotics, VTE prophylaxis, progressive ambulation and ADL's and discharge planning. The patient is planning to be discharged home with home health services

## 2015-12-13 NOTE — Anesthesia Preprocedure Evaluation (Signed)
Anesthesia Evaluation  Patient identified by MRN, date of birth, ID band Patient awake    Reviewed: Allergy & Precautions, NPO status , Patient's Chart, lab work & pertinent test results  Airway Mallampati: II  TM Distance: >3 FB Neck ROM: Full    Dental no notable dental hx.    Pulmonary neg pulmonary ROS,    Pulmonary exam normal breath sounds clear to auscultation       Cardiovascular hypertension, Pt. on medications Normal cardiovascular exam Rhythm:Regular Rate:Normal     Neuro/Psych negative neurological ROS  negative psych ROS   GI/Hepatic negative GI ROS, Neg liver ROS,   Endo/Other  negative endocrine ROS  Renal/GU negative Renal ROS  negative genitourinary   Musculoskeletal negative musculoskeletal ROS (+)   Abdominal   Peds negative pediatric ROS (+)  Hematology negative hematology ROS (+)   Anesthesia Other Findings   Reproductive/Obstetrics negative OB ROS                             Anesthesia Physical Anesthesia Plan  ASA: II  Anesthesia Plan: Spinal   Post-op Pain Management:    Induction: Intravenous  Airway Management Planned: Simple Face Mask  Additional Equipment:   Intra-op Plan:   Post-operative Plan:   Informed Consent: I have reviewed the patients History and Physical, chart, labs and discussed the procedure including the risks, benefits and alternatives for the proposed anesthesia with the patient or authorized representative who has indicated his/her understanding and acceptance.   Dental advisory given  Plan Discussed with: CRNA and Surgeon  Anesthesia Plan Comments:         Anesthesia Quick Evaluation  

## 2015-12-13 NOTE — Op Note (Signed)
NAMESHENNA, Karen Vaughn NO.:  [id number]  MEDICAL RECORD NO.:  [id number]  LOCATION:  [facility]                         FACILITY:  [facility]  PHYSICIAN:  Karen Vaughn. Karen Vaughn, M.D.DATE OF BIRTH:  12/18/1950  DATE OF PROCEDURE:  12/13/2015 DATE OF DISCHARGE:                              OPERATIVE REPORT   PREOPERATIVE DIAGNOSIS:  Primary osteoarthritis and degenerative joint disease of right knee.  POSTOPERATIVE DIAGNOSIS:  Primary osteoarthritis and degenerative joint disease of right knee.  PROCEDURE:  Right total knee arthroplasty.  IMPLANTS:  Stryker Triathlon knee with size 2 femur, size 3 tibial tray, 9-mm fix-bearing polyethylene insert, size 29 patellar button.  SURGEON:  Karen Vaughn. Karen Vaughn, M.D.  ASSISTANT:  Karen Vaughn, RNFA  ANESTHESIA:  Spinal.  ANTIBIOTICS:  2 g of IV Ancef.  TOURNIQUET TIME:  Under an hour and a half.  BLOOD LOSS:  200 mL.  COMPLICATIONS:  None.  INDICATIONS:  Karen Vaughn is a 65 year old female, well known to me.  She has known primary osteoarthritis and degenerative joint disease involving her right knee.  She has tried and failed all forms of conservative treatment.  Her pain is daily and it is detrimentally affected her activities of daily living, her quality of life and her mobility.  At this point, she wished to proceed with a total knee arthroplasty.  She understands the risk of acute blood loss anemia, nerve and vessel injury, fracture, infection, dislocation, DVT.  She understands our goals are decreased pain, improved mobility, and overall improved quality of life.  PROCEDURE DESCRIPTION:  After informed consent was obtained, appropriate right knee was marked.  She was brought to the operating room and spinal anesthesia was obtained.  She was then placed supine on the operating table.  A nonsterile tourniquet was placed around her upper right leg and right leg was prepped and draped from the thigh down the  toes with DuraPrep and sterile drapes including sterile stockinette.  Time-out was called and she was identified as correct patient and correct right knee. I then used an Esmarch to wrap out the leg and tourniquet was inflated to 300 mm of pressure.  We then made an incision over the patella and carried this proximally and distally.  We dissected down the knee joint and carried out a medial parapatellar arthrotomy finding a very large joint effusion and significant arthritis throughout the knee.  We removed all the periarticular osteophytes we could, remnants of the ACL and PCL, and the medial and lateral meniscus were also removed.  With the knee in a flexed position, we set our extramedullary cutting guide to take 9 mm off the high side, correcting for varus and valgus and neutral slope.  We made this cut without difficulty.  We then went to the femur and through an intercondylar notch of the femur with the knee in a flexed position, we placed an intramedullary alignment guide, setting for 5 degrees, externally rotated for right knee.  We then made our distal femoral cut of 8 mm.  We brought the knee back down to straight position and was extended with a 9-mm extension block, we had achieved full extension.  We went back to  the femur and then put our femoral sizing guide based off the epicondylar axis in 3 degrees right externally rotated.  We sized for a size 2 femur, made our anterior and posterior cuts as well as our chamfer cuts after placing the 4-in-1 cutting block.  We then made our femoral box cut.  We went to the tibia and then chose a size 3 tibia tray covering the tibial plateau, setting our rotation off the tibial tubercle on the femur.  We then made the keel punch off this.  With the size 3 tibial tray followed by the 2 trial femur, we tried a 9-mm fix-bearing polyethylene insert.  We put the knee through range of motion and I was pleased with stability and range of motion.   So, we went ahead and made our patellar cut cutting down to a size 14-mm thickness for a polyethylene button, size 29.  We drilled three holes for this too.  We then removed all trial components and irrigated the knee with normal saline solution using pulsatile lavage.  We mixed our cement and cemented the real Stryker Triathlon tibial tray size 3, followed by the real size 2 femur.  We removed cement debris from the knee and placed the real 9-mm fix-bearing polyethylene insert.  We cemented our patellar button as well.  Once the cement had hardened, we removed the cement debris from the knee and allowed the tourniquet down.  Hemostasis was obtained with electrocautery.  We irrigated the knee again with additional 1 L of normal saline solution and then closed the arthrotomy with interrupted #1 Vicryl suture followed by 0 Vicryl in the deep tissue, 2-0 Vicryl in the subcutaneous tissue and interrupted staples on the skin.  Xeroform and well-padded sterile dressing were applied.  She was taken to the recovery room in stable condition.  All final counts were correct. There were no complications noted.  Of note, Karen JupiterCarla Vaughn, RNFA's assistance was crucial throughout this whole case and assistance was very much appreciated.     Karen Pandahristopher Y. Karen IvanBlackman, M.D.     CYB/MEDQ  D:  12/13/2015  T:  12/13/2015  Job:  [id number]

## 2015-12-13 NOTE — Brief Op Note (Signed)
12/13/2015  12:15 PM  PATIENT:  Karen Vaughn  65 y.o. female  PRE-OPERATIVE DIAGNOSIS:  Severe osteoarthritis right knee  POST-OPERATIVE DIAGNOSIS:  Severe osteoarthritis right knee  PROCEDURE:  Procedure(s): RIGHT TOTAL KNEE ARTHROPLASTY (Right)  SURGEON:  Surgeon(s) and Role:    * Kathryne Hitchhristopher Y Blackman, MD - Primary  ASSISTANTS: Patrick Jupiterarla Bethune, RNFA   ANESTHESIA:   spinal  EBL:  Total I/O In: 1000 [I.V.:1000] Out: -   COUNTS:  YES  TOURNIQUET:   Total Tourniquet Time Documented: Thigh (Right) - 66 minutes Total: Thigh (Right) - 66 minutes   DICTATION: [id number]  PLAN OF CARE: Admit to inpatient   PATIENT DISPOSITION:  PACU - hemodynamically stable.   Delay start of Pharmacological VTE agent (>24hrs) due to surgical blood loss or risk of bleeding: no

## 2015-12-13 NOTE — Transfer of Care (Signed)
Immediate Anesthesia Transfer of Care Note  Patient: Karen Vaughn  Procedure(s) Performed: Procedure(s): RIGHT TOTAL KNEE ARTHROPLASTY (Right)  Patient Location: PACU  Anesthesia Type:Spinal  Level of Consciousness: awake, alert , oriented and patient cooperative  Airway & Oxygen Therapy: Patient Spontanous Breathing and Patient connected to face mask oxygen  Post-op Assessment: Report given to RN, Post -op Vital signs reviewed and stable, Patient moving all extremities X 4 and Patient able to stick tongue midline  Post vital signs: Reviewed and stable  Last Vitals:  Filed Vitals:   12/13/15 0834  BP: 137/82  Pulse: 71  Temp: 36.4 C  Resp: 20    Complications: No apparent anesthesia complications

## 2015-12-14 ENCOUNTER — Encounter (HOSPITAL_COMMUNITY): Payer: Self-pay | Admitting: Orthopaedic Surgery

## 2015-12-14 MED ORDER — RIVAROXABAN 10 MG PO TABS
10.0000 mg | ORAL_TABLET | Freq: Every day | ORAL | Status: DC
Start: 2015-12-14 — End: 2016-04-21

## 2015-12-14 MED ORDER — TRAMADOL HCL 50 MG PO TABS: 50.0000 mg | ORAL_TABLET | Freq: Four times a day (QID) | ORAL | Status: DC | PRN

## 2015-12-14 MED ORDER — METHOCARBAMOL 500 MG PO TABS: 500.0000 mg | ORAL_TABLET | Freq: Four times a day (QID) | ORAL | Status: DC | PRN

## 2015-12-14 NOTE — Progress Notes (Signed)
Patient ID: Karen IshikawaBarbara R Emory, female   DOB: 30-Mar-1951, 65 y.o.   MRN: [id number] Doing well this evening.  Can be discharged to home tomorrow (4/22).

## 2015-12-14 NOTE — Care Management Note (Signed)
Case Management Note  Patient Details  Name: Little IshikawaBarbara R Sek MRN: [id number] Date of Birth: 11-27-1950  Subjective/Objective:           S/p right total knee arthroplasty         Action/Plan[facility] MD [facility]. Spoke with patient and her husband, no change in discharge plan. Husband will be able to assist her after discharge. Patient has a rolling walker,contacted [facility] and requested 3N1 be delivered to patient's room.   Expected Discharge Date:                  Expected Discharge Plan:  Home w Home Health Services  In-House Referral:  NA  Discharge planning Services  CM Consult  Post Acute Care Choice:  Durable Medical Equipment, Home Health Choice offered to:  Patient  DME Arranged:  3-N-1 DME Agency:  [facility]  HH [facility]  [facility]  Status of Service:  In process, will continue to follow  Medicare Important Message Given:    Date Medicare IM Given:    Medicare IM give by:    Date Additional Medicare IM Given:    Additional Medicare Important Message give by:     If discussed at Long Length of Stay Meetings, dates discussed:    Additional Comments:  Monica BectonKrieg, Jaray Boliver Watson, RN 12/14/2015, 11:26 AM

## 2015-12-14 NOTE — Progress Notes (Signed)
Physical Therapy Treatment Patient Details Name: Karen IshikawaBarbara R Lolli MRN: [id number] DOB: 1950/11/12 Today's Date: 12/14/2015    History of Present Illness 65 yo admitted for right TKA. PMHx: HTN, obesity, OA    PT Comments    Pt pleasant and willing to work with therapy. Pt nauseated throughout session and vomited at end of session, nurse notified. Pt improved with mobility and gait compared to AM session, but still has deficits in ROM, strength, and activity tolerance. Will continue to follow.  Follow Up Recommendations  Home health PT     Equipment Recommendations       Recommendations for Other Services       Precautions / Restrictions Precautions Precautions: Knee Restrictions Weight Bearing Restrictions: Yes RLE Weight Bearing: Weight bearing as tolerated    Mobility  Bed Mobility Overal bed mobility: Modified Independent                Transfers Overall transfer level: Needs assistance Equipment used: None   Sit to Stand: Min guard;Min assist         General transfer comment: x2 trials from bed and toilet. min assist for balance with first trial, guard for safety with second trial. cues for hand placement.  Ambulation/Gait Ambulation/Gait assistance: Min guard Ambulation Distance (Feet): 100 Feet Assistive device: Rolling walker (2 wheeled) Gait Pattern/deviations: Step-to pattern;Step-through pattern   Gait velocity interpretation: Below normal speed for age/gender General Gait Details: Cues for sequence, position in RW, looking up. progressed from step-to to step through after cues.   Stairs            Wheelchair Mobility    Modified Rankin (Stroke Patients Only)       Balance                                    Cognition Arousal/Alertness: Awake/alert Behavior During Therapy: WFL for tasks assessed/performed Overall Cognitive Status: Within Functional Limits for tasks assessed                       Exercises Total Joint Exercises Quad Sets: AROM;Right;10 reps;Supine Heel Slides: AROM;Right;10 reps;Supine Hip ABduction/ADduction: AROM;Right;10 reps;Supine Straight Leg Raises: AAROM;Right;10 reps;Supine Goniometric ROM: grossly 10-35    General Comments        Pertinent Vitals/Pain Pain Assessment: 0-10 Pain Score: 3  Pain Location: right knee Pain Descriptors / Indicators: Aching Pain Intervention(s): Limited activity within patient's tolerance;Monitored during session;Premedicated before session;Ice applied    Home Living                      Prior Function            PT Goals (current goals can now be found in the care plan section) Progress towards PT goals: Progressing toward goals    Frequency       PT Plan Current plan remains appropriate    Co-evaluation             End of Session Equipment Utilized During Treatment: Gait belt Activity Tolerance: Patient tolerated treatment well;Other (comment) (pt limited by nausea) Patient left: in chair;with family/visitor present;with call bell/phone within reach     Time: 1211-1241 PT Time Calculation (min) (ACUTE ONLY): 30 min  Charges:  $Gait Training: 8-22 mins $Therapeutic Exercise: 8-22 mins  G CodesCherene Julian 12/14/2015, 12:54 PM   Cherene Julian, SPT [phone]

## 2015-12-14 NOTE — Progress Notes (Signed)
Utilization review completed.  

## 2015-12-14 NOTE — Discharge Instructions (Addendum)
Information on my medicine - XARELTO® (Rivaroxaban) ° °This medication education was reviewed with me or my healthcare representative as part of my discharge preparation.  The pharmacist that spoke with me during my hospital stay was:  Bell, Michelle T, RPH ° °Why was Xarelto® prescribed for you? °Xarelto® was prescribed for you to reduce the risk of blood clots forming after orthopedic surgery. The medical term for these abnormal blood clots is venous thromboembolism (VTE). ° °What do you need to know about xarelto® ? °Take your Xarelto® ONCE DAILY at the same time every day. °You may take it either with or without food. ° °If you have difficulty swallowing the tablet whole, you may crush it and mix in applesauce just prior to taking your dose. ° °Take Xarelto® exactly as prescribed by your doctor and DO NOT stop taking Xarelto® without talking to the doctor who prescribed the medication.  Stopping without other VTE prevention medication to take the place of Xarelto® may increase your risk of developing a clot. ° °After discharge, you should have regular check-up appointments with your healthcare provider that is prescribing your Xarelto®.   ° °What do you do if you miss a dose? °If you miss a dose, take it as soon as you remember on the same day then continue your regularly scheduled once daily regimen the next day. Do not take two doses of Xarelto® on the same day.  ° °Important Safety Information °A possible side effect of Xarelto® is bleeding. You should call your healthcare provider right away if you experience any of the following: °? Bleeding from an injury or your nose that does not stop. °? Unusual colored urine (red or dark brown) or unusual colored stools (red or black). °? Unusual bruising for unknown reasons. °? A serious fall or if you hit your head (even if there is no bleeding). ° °Some medicines may interact with Xarelto® and might increase your risk of bleeding while on Xarelto®. To help avoid  this, consult your healthcare provider or pharmacist prior to using any new prescription or non-prescription medications, including herbals, vitamins, non-steroidal anti-inflammatory drugs (NSAIDs) and supplements. ° °This website has more information on Xarelto®: www.xarelto.com. ° °INSTRUCTIONS AFTER JOINT REPLACEMENT  ° °o Remove items at home which could result in a fall. This includes throw rugs or furniture in walking pathways °o ICE to the affected joint every three hours while awake for 30 minutes at a time, for at least the first 3-5 days, and then as needed for pain and swelling.  Continue to use ice for pain and swelling. You may notice swelling that will progress down to the foot and ankle.  This is normal after surgery.  Elevate your leg when you are not up walking on it.   °o Continue to use the breathing machine you got in the hospital (incentive spirometer) which will help keep your temperature down.  It is common for your temperature to cycle up and down following surgery, especially at night when you are not up moving around and exerting yourself.  The breathing machine keeps your lungs expanded and your temperature down. ° ° °DIET:  As you were doing prior to hospitalization, we recommend a well-balanced diet. ° °DRESSING / WOUND CARE / SHOWERING ° °Keep the surgical dressing until follow up.  The dressing is water proof, so you can shower without any extra covering.  IF THE DRESSING FALLS OFF or the wound gets wet inside, change the dressing with sterile gauze.    Please use good hand washing techniques before changing the dressing.  Do not use any lotions or creams on the incision until instructed by your surgeon.   ° °ACTIVITY ° °o Increase activity slowly as tolerated, but follow the weight bearing instructions below.   °o No driving for 6 weeks or until further direction given by your physician.  You cannot drive while taking narcotics.  °o No lifting or carrying greater than 10 lbs. until  further directed by your surgeon. °o Avoid periods of inactivity such as sitting longer than an hour when not asleep. This helps prevent blood clots.  °o You may return to work once you are authorized by your doctor.  ° ° ° °WEIGHT BEARING  ° °Weight bearing as tolerated with assist device (walker, cane, etc) as directed, use it as long as suggested by your surgeon or therapist, typically at least 4-6 weeks. ° ° °EXERCISES ° °Results after joint replacement surgery are often greatly improved when you follow the exercise, range of motion and muscle strengthening exercises prescribed by your doctor. Safety measures are also important to protect the joint from further injury. Any time any of these exercises cause you to have increased pain or swelling, decrease what you are doing until you are comfortable again and then slowly increase them. If you have problems or questions, call your caregiver or physical therapist for advice.  ° °Rehabilitation is important following a joint replacement. After just a few days of immobilization, the muscles of the leg can become weakened and shrink (atrophy).  These exercises are designed to build up the tone and strength of the thigh and leg muscles and to improve motion. Often times heat used for twenty to thirty minutes before working out will loosen up your tissues and help with improving the range of motion but do not use heat for the first two weeks following surgery (sometimes heat can increase post-operative swelling).  ° °These exercises can be done on a training (exercise) mat, on the floor, on a table or on a bed. Use whatever works the best and is most comfortable for you.    Use music or television while you are exercising so that the exercises are a pleasant break in your day. This will make your life better with the exercises acting as a break in your routine that you can look forward to.   Perform all exercises about fifteen times, three times per day or as directed.   You should exercise both the operative leg and the other leg as well. ° °Exercises include: °  °• Quad Sets - Tighten up the muscle on the front of the thigh (Quad) and hold for 5-10 seconds.   °• Straight Leg Raises - With your knee straight (if you were given a brace, keep it on), lift the leg to 60 degrees, hold for 3 seconds, and slowly lower the leg.  Perform this exercise against resistance later as your leg gets stronger.  °• Leg Slides: Lying on your back, slowly slide your foot toward your buttocks, bending your knee up off the floor (only go as far as is comfortable). Then slowly slide your foot back down until your leg is flat on the floor again.  °• Angel Wings: Lying on your back spread your legs to the side as far apart as you can without causing discomfort.  °• Hamstring Strength:  Lying on your back, push your heel against the floor with your leg straight by tightening up the   muscles of your buttocks.  Repeat, but this time bend your knee to a comfortable angle, and push your heel against the floor.  You may put a pillow under the heel to make it more comfortable if necessary.  ° °A rehabilitation program following joint replacement surgery can speed recovery and prevent re-injury in the future due to weakened muscles. Contact your doctor or a physical therapist for more information on knee rehabilitation.  ° ° °CONSTIPATION ° °Constipation is defined medically as fewer than three stools per week and severe constipation as less than one stool per week.  Even if you have a regular bowel pattern at home, your normal regimen is likely to be disrupted due to multiple reasons following surgery.  Combination of anesthesia, postoperative narcotics, change in appetite and fluid intake all can affect your bowels.  ° °YOU MUST use at least one of the following options; they are listed in order of increasing strength to get the job done.  They are all available over the counter, and you may need to use some,  POSSIBLY even all of these options:   ° °Drink plenty of fluids (prune juice may be helpful) and high fiber foods °Colace 100 mg by mouth twice a day  °Senokot for constipation as directed and as needed Dulcolax (bisacodyl), take with full glass of water  °Miralax (polyethylene glycol) once or twice a day as needed. ° °If you have tried all these things and are unable to have a bowel movement in the first 3-4 days after surgery call either your surgeon or your primary doctor.   ° °If you experience loose stools or diarrhea, hold the medications until you stool forms back up.  If your symptoms do not get better within 1 week or if they get worse, check with your doctor.  If you experience "the worst abdominal pain ever" or develop nausea or vomiting, please contact the office immediately for further recommendations for treatment. ° ° °ITCHING:  If you experience itching with your medications, try taking only a single pain pill, or even half a pain pill at a time.  You can also use Benadryl over the counter for itching or also to help with sleep.  ° °TED HOSE STOCKINGS:  Use stockings on both legs until for at least 2 weeks or as directed by physician office. They may be removed at night for sleeping. ° °MEDICATIONS:  See your medication summary on the “After Visit Summary” that nursing will review with you.  You may have some home medications which will be placed on hold until you complete the course of blood thinner medication.  It is important for you to complete the blood thinner medication as prescribed. ° °PRECAUTIONS:  If you experience chest pain or shortness of breath - call 911 immediately for transfer to the hospital emergency department.  ° °If you develop a fever greater that 101 F, purulent drainage from wound, increased redness or drainage from wound, foul odor from the wound/dressing, or calf pain - CONTACT YOUR SURGEON.   °                                                °FOLLOW-UP APPOINTMENTS:  If  you do not already have a post-op appointment, please call the office for an appointment to be seen by your surgeon.  Guidelines for how   soon to be seen are listed in your “After Visit Summary”, but are typically between 1-4 weeks after surgery. ° °OTHER INSTRUCTIONS:  ° °Knee Replacement:  Do not place pillow under knee, focus on keeping the knee straight while resting. CPM instructions: 0-90 degrees, 2 hours in the morning, 2 hours in the afternoon, and 2 hours in the evening. Place foam block, curve side up under heel at all times except when in CPM or when walking.  DO NOT modify, tear, cut, or change the foam block in any way. ° °MAKE SURE YOU:  °• Understand these instructions.  °• Get help right away if you are not doing well or get worse.  ° ° °Thank you for letting us be a part of your medical care team.  It is a privilege we respect greatly.  We hope these instructions will help you stay on track for a fast and full recovery!  ° °

## 2015-12-14 NOTE — Progress Notes (Signed)
Subjective: 1 Day Post-Op Procedure(s) (LRB): RIGHT TOTAL KNEE ARTHROPLASTY (Right) Patient reports pain as moderate.    Objective: Vital signs in last 24 hours: Temp:  [97.6 F (36.4 C)-99 F (37.2 C)] 98.7 F (37.1 C) (04/21 0454) Pulse Rate:  [52-99] 99 (04/21 0454) Resp:  [7-27] 18 (04/21 0454) BP: (92-150)/(50-82) 122/60 mmHg (04/21 0454) SpO2:  [86 %-99 %] 89 % (04/21 0454) Weight:  [88.905 kg (196 lb)] 88.905 kg (196 lb) (04/20 0834)  Intake/Output from previous day: 04/20 0701 - 04/21 0700 In: 1600 [I.V.:1600] Out: 25 [Blood:25] Intake/Output this shift:     Recent Labs  12/11/15 1332  HGB 14.0    Recent Labs  12/11/15 1332  WBC 7.9  RBC 4.83  HCT 42.7  PLT 252    Recent Labs  12/11/15 1332  NA 141  K 3.5  CL 102  CO2 28  BUN 10  CREATININE 0.92  GLUCOSE 125*  CALCIUM 9.5   No results for input(s): LABPT, INR in the last 72 hours.  Sensation intact distally Intact pulses distally Dorsiflexion/Plantar flexion intact Incision: dressing C/D/I Compartment soft  Assessment/Plan: 1 Day Post-Op Procedure(s) (LRB): RIGHT TOTAL KNEE ARTHROPLASTY (Right) Up with therapy  BLACKMAN,CHRISTOPHER Y 12/14/2015, 6:57 AM

## 2015-12-14 NOTE — Evaluation (Signed)
Physical Therapy Evaluation Patient Details Name: Karen Vaughn MRN: [id number] DOB: 1951/05/14 Today's Date: 12/14/2015   History of Present Illness  65 yo admitted for right TKA. PMHx: HTN, obesity, OA  Clinical Impression  Pt pleasant and willing to work with therapy. Pt motivated to mobilize so that she can return home as soon as possible. Pt nauseated throughout session, but refused medication and was able to perform mobility. Educated pt on HEP, CPM frequency, and elevating foot when resting. Pt has decreased strength and ROM, and would benefit from therapy in order to maximize independence and decrease caregiver burden. Follow up therapy and equipment listed below.    Follow Up Recommendations Home health PT    Equipment Recommendations  None recommended by PT    Recommendations for Other Services OT consult     Precautions / Restrictions Precautions Precautions: Knee Restrictions RLE Weight Bearing: Weight bearing as tolerated      Mobility  Bed Mobility Overal bed mobility: Modified Independent                Transfers Overall transfer level: Needs assistance   Transfers: Sit to/from Stand Sit to Stand: Min guard         General transfer comment: cues for hand placement and safety  Ambulation/Gait Ambulation/Gait assistance: Min guard Ambulation Distance (Feet): 50 Feet Assistive device: Rolling walker (2 wheeled) Gait Pattern/deviations: Step-to pattern;Decreased stride length;Decreased dorsiflexion - right   Gait velocity interpretation: Below normal speed for age/gender General Gait Details: cues for posture, position in RW, sequence, safety and looking up 50' then 10' after toileting  Stairs            Wheelchair Mobility    Modified Rankin (Stroke Patients Only)       Balance Overall balance assessment: No apparent balance deficits (not formally assessed)                                           Pertinent  Vitals/Pain Pain Assessment: 0-10 Pain Score: 5  Pain Location: right knee Pain Descriptors / Indicators: Throbbing Pain Intervention(s): Limited activity within patient's tolerance;Premedicated before session;Repositioned;Ice applied    Home Living Family/patient expects to be discharged to:: Private residence Living Arrangements: Spouse/significant other Available Help at Discharge: Family;Available 24 hours/day Type of Home: House Home Access: Stairs to enter Entrance Stairs-Rails: Doctor, general practice of Steps: 6 Home Layout: Two level;Able to live on main level with bedroom/bathroom Home Equipment: Dan Humphreys - 2 wheels;Crutches;Wheelchair - manual      Prior Function Level of Independence: Independent               Hand Dominance        Extremity/Trunk Assessment   Upper Extremity Assessment: Overall WFL for tasks assessed           Lower Extremity Assessment: RLE deficits/detail RLE Deficits / Details: decreased ROM and strength as expected post op    Cervical / Trunk Assessment: Normal  Communication   Communication: No difficulties  Cognition Arousal/Alertness: Awake/alert Behavior During Therapy: WFL for tasks assessed/performed Overall Cognitive Status: Within Functional Limits for tasks assessed                      General Comments      Exercises Total Joint Exercises Quad Sets: AROM;Right;5 reps;Supine Heel Slides: Right;5 reps;Supine;AAROM Straight Leg Raises: Right;5 reps;Supine;AAROM  Assessment/Plan    PT Assessment Patient needs continued PT services  PT Diagnosis Difficulty walking;Acute pain   PT Problem List Decreased strength;Decreased range of motion;Decreased activity tolerance;Pain;Decreased mobility;Decreased knowledge of use of DME  PT Treatment Interventions DME instruction;Gait training;Stair training;Functional mobility training;Therapeutic activities;Therapeutic exercise;Patient/family education    PT Goals (Current goals can be found in the Care Plan section) Acute Rehab PT Goals Patient Stated Goal: lose weight PT Goal Formulation: With patient Time For Goal Achievement: 12/21/15 Potential to Achieve Goals: Good    Frequency 7X/week   Barriers to discharge        Co-evaluation               End of Session Equipment Utilized During Treatment: Gait belt Activity Tolerance: Patient tolerated treatment well Patient left: in chair;with call bell/phone within reach Nurse Communication: Mobility status         Time: [id number] PT Time Calculation (min) (ACUTE ONLY): 29 min   Charges:   PT Evaluation $PT Eval Moderate Complexity: 1 Procedure PT Treatments $Gait Training: 8-22 mins   PT G CodesCherene Julian:        Nazirah Tri 12/14/2015, 8:40 AM   Cherene JulianPaola Zoe Creasman, SPT [phone]

## 2015-12-15 NOTE — Progress Notes (Signed)
Subjective: Patient stable pain controlled   Objective: Vital signs in last 24 hours: Temp:  [98.9 F (37.2 C)-99.3 F (37.4 C)] 99.3 F (37.4 C) (04/22 0425) Pulse Rate:  [82-89] 89 (04/22 0425) Resp:  [18] 18 (04/22 0425) BP: (113-137)/(47-64) 113/61 mmHg (04/22 0425) SpO2:  [91 %-94 %] 91 % (04/22 0425)  Intake/Output from previous day: 04/21 0701 - 04/22 0700 In: 720 [P.O.:720] Out: -  Intake/Output this shift:    Exam:  Dorsiflexion/Plantar flexion intact  Labs: No results for input(s): HGB in the last 72 hours. No results for input(s): WBC, RBC, HCT, PLT in the last 72 hours. No results for input(s): NA, K, CL, CO2, BUN, CREATININE, GLUCOSE, CALCIUM in the last 72 hours. No results for input(s): LABPT, INR in the last 72 hours.  Assessment/Plan: Patient doing well.  CPM 55.  Discharge today.   DEAN,GREGORY SCOTT 12/15/2015, 6:22 AM

## 2015-12-15 NOTE — Progress Notes (Signed)
Physical Therapy Treatment Patient Details Name: Little IshikawaBarbara R Cressey MRN: [id number] DOB: 17-Sep-1950 Today's Date: 12/15/2015    History of Present Illness 65 yo admitted for right TKA. PMHx: HTN, obesity, OA    PT Comments    Pt pleasant and wiling to work with therapy. Pt reported that she is no longer feeling nauseous. Pt greatly improved from prior visit with mobility and strength. Pt still has deficits in ROM and strength, but has completed all acute therapy goals and has safe mobility for home. Will continue to follow.   Follow Up Recommendations  Home health PT     Equipment Recommendations       Recommendations for Other Services       Precautions / Restrictions Precautions Precautions: Knee Restrictions Weight Bearing Restrictions: Yes RLE Weight Bearing: Weight bearing as tolerated    Mobility  Bed Mobility Overal bed mobility: Modified Independent             General bed mobility comments: pt able to get back in bed with no difficulty  Transfers Overall transfer level: Modified independent               General transfer comment: pt able to get up quickly and easily without cues  Ambulation/Gait Ambulation/Gait assistance: Supervision   Assistive device: Rolling walker (2 wheeled) Gait Pattern/deviations: Step-through pattern   Gait velocity interpretation: at or above normal speed for age/gender General Gait Details: cues for sequence, position in RW, and heel strike.   Stairs Stairs: Yes Stairs assistance: Supervision Stair Management: Two rails;Step to pattern Number of Stairs: 5 General stair comments: educated pt on stair sequence.  Wheelchair Mobility    Modified Rankin (Stroke Patients Only)       Balance                                    Cognition Arousal/Alertness: Awake/alert Behavior During Therapy: WFL for tasks assessed/performed Overall Cognitive Status: Within Functional Limits for tasks assessed                       Exercises Total Joint Exercises Heel Slides: AROM;Right;15 reps;Seated Hip ABduction/ADduction: AROM;Right;15 reps;Seated Straight Leg Raises: Right;15 reps;Seated;AROM [employer] Quad: AROM;Seated;Right;15 reps Goniometric ROM: 10-55    General Comments        Pertinent Vitals/Pain Pain Assessment: No/denies pain    Home Living                      Prior Function            PT Goals (current goals can now be found in the care plan section) Progress towards PT goals: Progressing toward goals    Frequency       PT Plan Current plan remains appropriate    Co-evaluation             End of Session Equipment Utilized During Treatment: Gait belt Activity Tolerance: Patient tolerated treatment well;No increased pain Patient left: with call bell/phone within reach;in bed     Time: 0840-0907 PT Time Calculation (min) (ACUTE ONLY): 27 min  Charges:  $Gait Training: 8-22 mins $Therapeutic Exercise: 8-22 mins                    G Codes:      Cherene Julianaola Aimy Sweeting 12/15/2015, 9:20 AM   Cherene JulianPaola Johnny Latu, SPT [phone]

## 2015-12-15 NOTE — Progress Notes (Signed)
Discharge instructions and prescriptions provided to patient and spouse.  Patient indicates all equipment [employer](Front wheel walker, BSC) has been taken to her home.  Ice packs with patient.  She currently has no discharge related questions.  IV removed.  All personal belongings currently with patient at this time.

## 2015-12-16 DIAGNOSIS — Z96651 Presence of right artificial knee joint: Secondary | ICD-10-CM | POA: Diagnosis not present

## 2015-12-16 DIAGNOSIS — E669 Obesity, unspecified: Secondary | ICD-10-CM | POA: Diagnosis not present

## 2015-12-16 DIAGNOSIS — M1990 Unspecified osteoarthritis, unspecified site: Secondary | ICD-10-CM | POA: Diagnosis not present

## 2015-12-16 DIAGNOSIS — Z471 Aftercare following joint replacement surgery: Secondary | ICD-10-CM | POA: Diagnosis not present

## 2015-12-16 DIAGNOSIS — Z6834 Body mass index (BMI) 34.0-34.9, adult: Secondary | ICD-10-CM | POA: Diagnosis not present

## 2015-12-16 DIAGNOSIS — I10 Essential (primary) hypertension: Secondary | ICD-10-CM | POA: Diagnosis not present

## 2015-12-16 NOTE — Anesthesia Postprocedure Evaluation (Signed)
Anesthesia Post Note  Patient: Karen IshikawaBarbara R Albarran  Procedure(s) Performed: Procedure(s) (LRB): RIGHT TOTAL KNEE ARTHROPLASTY (Right)  Patient location during evaluation: PACU Anesthesia Type: Spinal Level of consciousness: awake and alert Pain management: pain level controlled Vital Signs Assessment: post-procedure vital signs reviewed and stable Respiratory status: spontaneous breathing, nonlabored ventilation, respiratory function stable and patient connected to nasal cannula oxygen Cardiovascular status: blood pressure returned to baseline and stable Postop Assessment: no signs of nausea or vomiting Anesthetic complications: no    Last Vitals:  Filed Vitals:   12/14/15 2116 12/15/15 0425  BP: 121/47 113/61  Pulse: 85 89  Temp: 37.2 C 37.4 C  Resp: 18 18    Last Pain:  Filed Vitals:   12/15/15 1044  PainSc: 0-No pain                 [name]

## 2015-12-17 DIAGNOSIS — I10 Essential (primary) hypertension: Secondary | ICD-10-CM | POA: Diagnosis not present

## 2015-12-17 DIAGNOSIS — Z471 Aftercare following joint replacement surgery: Secondary | ICD-10-CM | POA: Diagnosis not present

## 2015-12-17 DIAGNOSIS — Z6834 Body mass index (BMI) 34.0-34.9, adult: Secondary | ICD-10-CM | POA: Diagnosis not present

## 2015-12-17 DIAGNOSIS — M1990 Unspecified osteoarthritis, unspecified site: Secondary | ICD-10-CM | POA: Diagnosis not present

## 2015-12-17 DIAGNOSIS — E669 Obesity, unspecified: Secondary | ICD-10-CM | POA: Diagnosis not present

## 2015-12-17 DIAGNOSIS — Z96651 Presence of right artificial knee joint: Secondary | ICD-10-CM | POA: Diagnosis not present

## 2015-12-18 DIAGNOSIS — Z471 Aftercare following joint replacement surgery: Secondary | ICD-10-CM | POA: Diagnosis not present

## 2015-12-18 DIAGNOSIS — I10 Essential (primary) hypertension: Secondary | ICD-10-CM | POA: Diagnosis not present

## 2015-12-18 DIAGNOSIS — Z6834 Body mass index (BMI) 34.0-34.9, adult: Secondary | ICD-10-CM | POA: Diagnosis not present

## 2015-12-18 DIAGNOSIS — E669 Obesity, unspecified: Secondary | ICD-10-CM | POA: Diagnosis not present

## 2015-12-18 DIAGNOSIS — M1990 Unspecified osteoarthritis, unspecified site: Secondary | ICD-10-CM | POA: Diagnosis not present

## 2015-12-18 DIAGNOSIS — Z96651 Presence of right artificial knee joint: Secondary | ICD-10-CM | POA: Diagnosis not present

## 2015-12-19 DIAGNOSIS — Z6834 Body mass index (BMI) 34.0-34.9, adult: Secondary | ICD-10-CM | POA: Diagnosis not present

## 2015-12-19 DIAGNOSIS — M1990 Unspecified osteoarthritis, unspecified site: Secondary | ICD-10-CM | POA: Diagnosis not present

## 2015-12-19 DIAGNOSIS — I10 Essential (primary) hypertension: Secondary | ICD-10-CM | POA: Diagnosis not present

## 2015-12-19 DIAGNOSIS — Z471 Aftercare following joint replacement surgery: Secondary | ICD-10-CM | POA: Diagnosis not present

## 2015-12-19 DIAGNOSIS — Z96651 Presence of right artificial knee joint: Secondary | ICD-10-CM | POA: Diagnosis not present

## 2015-12-19 DIAGNOSIS — E669 Obesity, unspecified: Secondary | ICD-10-CM | POA: Diagnosis not present

## 2015-12-19 NOTE — Discharge Summary (Signed)
Patient ID: Karen Vaughn Steinhilber MRN: [id number] DOB/AGE: 28-Aug-1950 65 y.o.  Admit date: 12/13/2015 Discharge date: 12/19/2015  Admission Diagnoses:  Principal Problem:   Osteoarthritis of right knee Active Problems:   Status post total right knee replacement   Discharge Diagnoses:  Same  Past Medical History  Diagnosis Date  . Sinusitis   . Menopause     After TAH, premarin 2-3 years, now occasional hot flashes  . Atrophic vaginitis   . Overweight(278.02)   . PONV (postoperative nausea and vomiting)   . Hypertension     Surgeries: Procedure(s): RIGHT TOTAL KNEE ARTHROPLASTY on 12/13/2015   Consultants:    Discharged Condition: Improved  Hospital Course: Karen Vaughn Elliff is an 65 y.o. female who was admitted 12/13/2015 for operative treatment ofOsteoarthritis of right knee. Patient has severe unremitting pain that affects sleep, daily activities, and work/hobbies. After pre-op clearance the patient was taken to the operating room on 12/13/2015 and underwent  Procedure(s): RIGHT TOTAL KNEE ARTHROPLASTY.    Patient was given perioperative antibiotics:  Anti-infectives    Start     Dose/Rate Route Frequency Ordered Stop   12/13/15 1700  ceFAZolin (ANCEF) IVPB 1 g/50 mL premix     1 g 100 mL/hr over 30 Minutes Intravenous Every 6 hours 12/13/15 1548 12/14/15 0111   12/13/15 0945  ceFAZolin (ANCEF) IVPB 2g/100 mL premix     2 g 200 mL/hr over 30 Minutes Intravenous On call to O.Vaughn. 12/13/15 0849 12/13/15 1030   12/13/15 0851  ceFAZolin (ANCEF) 2-4 GM/100ML-% IVPB    Comments:  Lorenda IshiharaGibbs, Bonnie   : cabinet override      12/13/15 0851 12/13/15 2059       Patient was given sequential compression devices, early ambulation, and chemoprophylaxis to prevent DVT.  Patient benefited maximally from hospital stay and there were no complications.    Recent vital signs: No data found.    Recent laboratory studies: No results for input(s): WBC, HGB, HCT, PLT, NA, K, CL, CO2, BUN,  CREATININE, GLUCOSE, INR, CALCIUM in the last 72 hours.  Invalid input(s): PT, 2   Discharge Medications:     Medication List    TAKE these medications        amLODipine 2.5 MG tablet  Commonly known as:  NORVASC  Take 1 tablet (2.5 mg total) by mouth daily.     aspirin 81 MG tablet  Take 81 mg by mouth daily.     calcium carbonate 500 MG chewable tablet  Commonly known as:  TUMS - dosed in mg elemental calcium  Chew 1-4 tablets by mouth at bedtime as needed for indigestion or heartburn.     Calcium-Magnesium-Zinc Tabs  Take 4 tablets by mouth daily.     docusate sodium 100 MG capsule  Commonly known as:  COLACE  Take 100 mg by mouth daily.     hydrochlorothiazide 25 MG tablet  Commonly known as:  HYDRODIURIL  Take 1 tablet (25 mg total) by mouth daily.     Melatonin 3 MG Tabs  Take 9 mg by mouth at bedtime.     methocarbamol 500 MG tablet  Commonly known as:  ROBAXIN  Take 1 tablet (500 mg total) by mouth every 6 (six) hours as needed for muscle spasms.     omeprazole 20 MG capsule  Commonly known as:  PRILOSEC  Take 20 mg by mouth daily.     POLYVITES/FLUORIDE PO  Take 4 tablets by mouth daily. Type O  rivaroxaban 10 MG Tabs tablet  Commonly known as:  XARELTO  Take 1 tablet (10 mg total) by mouth daily with breakfast.     traMADol 50 MG tablet  Commonly known as:  ULTRAM  Take 1-2 tablets (50-100 mg total) by mouth every 6 (six) hours as needed for moderate pain.        Diagnostic Studies: Dg Knee Right Port  12/13/2015  CLINICAL DATA:  Status post right knee replacement EXAM: PORTABLE RIGHT KNEE - 1-2 VIEW COMPARISON:  None. FINDINGS: Right knee replacement is noted. No acute bony abnormality is noted. Air is noted in the surgical bed. IMPRESSION: Status post right knee replacement. Electronically Signed   By: Alcide Clever M.D.   On: 12/13/2015 13:33    Disposition: 01-Home or Self Care      Discharge Instructions    Call MD / Call 911     Complete by:  As directed   If you experience chest pain or shortness of breath, CALL 911 and be transported to the hospital emergency room.  If you develope a fever above 101 F, pus (white drainage) or increased drainage or redness at the wound, or calf pain, call your surgeon's office.     Constipation Prevention    Complete by:  As directed   Drink plenty of fluids.  Prune juice may be helpful.  You may use a stool softener, such as Colace (over the counter) 100 mg twice a day.  Use MiraLax (over the counter) for constipation as needed.     Diet - low sodium heart healthy    Complete by:  As directed      Increase activity slowly as tolerated    Complete by:  As directed            Follow-up Information    Follow up with [facility].   Why:  They will contact you to schedule home therapy visits.   Contact information:   [address] [place] [address] [phone]       Follow up with Kathryne Hitch, MD In 2 weeks.   Specialty:  Orthopedic Surgery   Contact information:   [address] [place] [address] [phone]        Signed: Kathryne Hitch 12/19/2015, 1:49 PM

## 2015-12-20 DIAGNOSIS — Z6834 Body mass index (BMI) 34.0-34.9, adult: Secondary | ICD-10-CM | POA: Diagnosis not present

## 2015-12-20 DIAGNOSIS — Z96651 Presence of right artificial knee joint: Secondary | ICD-10-CM | POA: Diagnosis not present

## 2015-12-20 DIAGNOSIS — I10 Essential (primary) hypertension: Secondary | ICD-10-CM | POA: Diagnosis not present

## 2015-12-20 DIAGNOSIS — Z471 Aftercare following joint replacement surgery: Secondary | ICD-10-CM | POA: Diagnosis not present

## 2015-12-20 DIAGNOSIS — M1990 Unspecified osteoarthritis, unspecified site: Secondary | ICD-10-CM | POA: Diagnosis not present

## 2015-12-20 DIAGNOSIS — E669 Obesity, unspecified: Secondary | ICD-10-CM | POA: Diagnosis not present

## 2015-12-24 DIAGNOSIS — E669 Obesity, unspecified: Secondary | ICD-10-CM | POA: Diagnosis not present

## 2015-12-24 DIAGNOSIS — I10 Essential (primary) hypertension: Secondary | ICD-10-CM | POA: Diagnosis not present

## 2015-12-24 DIAGNOSIS — Z6834 Body mass index (BMI) 34.0-34.9, adult: Secondary | ICD-10-CM | POA: Diagnosis not present

## 2015-12-24 DIAGNOSIS — Z471 Aftercare following joint replacement surgery: Secondary | ICD-10-CM | POA: Diagnosis not present

## 2015-12-24 DIAGNOSIS — Z96651 Presence of right artificial knee joint: Secondary | ICD-10-CM | POA: Diagnosis not present

## 2015-12-24 DIAGNOSIS — M1990 Unspecified osteoarthritis, unspecified site: Secondary | ICD-10-CM | POA: Diagnosis not present

## 2015-12-26 DIAGNOSIS — I10 Essential (primary) hypertension: Secondary | ICD-10-CM | POA: Diagnosis not present

## 2015-12-26 DIAGNOSIS — M1990 Unspecified osteoarthritis, unspecified site: Secondary | ICD-10-CM | POA: Diagnosis not present

## 2015-12-26 DIAGNOSIS — Z471 Aftercare following joint replacement surgery: Secondary | ICD-10-CM | POA: Diagnosis not present

## 2015-12-26 DIAGNOSIS — Z6834 Body mass index (BMI) 34.0-34.9, adult: Secondary | ICD-10-CM | POA: Diagnosis not present

## 2015-12-26 DIAGNOSIS — Z96651 Presence of right artificial knee joint: Secondary | ICD-10-CM | POA: Diagnosis not present

## 2015-12-26 DIAGNOSIS — E669 Obesity, unspecified: Secondary | ICD-10-CM | POA: Diagnosis not present

## 2015-12-27 DIAGNOSIS — M1711 Unilateral primary osteoarthritis, right knee: Secondary | ICD-10-CM | POA: Diagnosis not present

## 2015-12-28 ENCOUNTER — Encounter: Payer: Self-pay | Admitting: Internal Medicine

## 2015-12-31 DIAGNOSIS — M25461 Effusion, right knee: Secondary | ICD-10-CM | POA: Diagnosis not present

## 2015-12-31 DIAGNOSIS — M25561 Pain in right knee: Secondary | ICD-10-CM | POA: Diagnosis not present

## 2015-12-31 DIAGNOSIS — M25661 Stiffness of right knee, not elsewhere classified: Secondary | ICD-10-CM | POA: Diagnosis not present

## 2015-12-31 DIAGNOSIS — R262 Difficulty in walking, not elsewhere classified: Secondary | ICD-10-CM | POA: Diagnosis not present

## 2016-01-08 DIAGNOSIS — M25461 Effusion, right knee: Secondary | ICD-10-CM | POA: Diagnosis not present

## 2016-01-08 DIAGNOSIS — R262 Difficulty in walking, not elsewhere classified: Secondary | ICD-10-CM | POA: Diagnosis not present

## 2016-01-08 DIAGNOSIS — M25561 Pain in right knee: Secondary | ICD-10-CM | POA: Diagnosis not present

## 2016-01-08 DIAGNOSIS — M25661 Stiffness of right knee, not elsewhere classified: Secondary | ICD-10-CM | POA: Diagnosis not present

## 2016-01-10 DIAGNOSIS — M25461 Effusion, right knee: Secondary | ICD-10-CM | POA: Diagnosis not present

## 2016-01-10 DIAGNOSIS — M25661 Stiffness of right knee, not elsewhere classified: Secondary | ICD-10-CM | POA: Diagnosis not present

## 2016-01-10 DIAGNOSIS — R262 Difficulty in walking, not elsewhere classified: Secondary | ICD-10-CM | POA: Diagnosis not present

## 2016-01-10 DIAGNOSIS — M25561 Pain in right knee: Secondary | ICD-10-CM | POA: Diagnosis not present

## 2016-01-15 DIAGNOSIS — M25561 Pain in right knee: Secondary | ICD-10-CM | POA: Diagnosis not present

## 2016-01-15 DIAGNOSIS — M25461 Effusion, right knee: Secondary | ICD-10-CM | POA: Diagnosis not present

## 2016-01-15 DIAGNOSIS — R262 Difficulty in walking, not elsewhere classified: Secondary | ICD-10-CM | POA: Diagnosis not present

## 2016-01-15 DIAGNOSIS — M25661 Stiffness of right knee, not elsewhere classified: Secondary | ICD-10-CM | POA: Diagnosis not present

## 2016-01-17 DIAGNOSIS — R262 Difficulty in walking, not elsewhere classified: Secondary | ICD-10-CM | POA: Diagnosis not present

## 2016-01-17 DIAGNOSIS — M25461 Effusion, right knee: Secondary | ICD-10-CM | POA: Diagnosis not present

## 2016-01-17 DIAGNOSIS — M25661 Stiffness of right knee, not elsewhere classified: Secondary | ICD-10-CM | POA: Diagnosis not present

## 2016-01-17 DIAGNOSIS — M25561 Pain in right knee: Secondary | ICD-10-CM | POA: Diagnosis not present

## 2016-01-22 DIAGNOSIS — M25561 Pain in right knee: Secondary | ICD-10-CM | POA: Diagnosis not present

## 2016-01-22 DIAGNOSIS — M25661 Stiffness of right knee, not elsewhere classified: Secondary | ICD-10-CM | POA: Diagnosis not present

## 2016-01-22 DIAGNOSIS — R262 Difficulty in walking, not elsewhere classified: Secondary | ICD-10-CM | POA: Diagnosis not present

## 2016-01-22 DIAGNOSIS — M25461 Effusion, right knee: Secondary | ICD-10-CM | POA: Diagnosis not present

## 2016-01-24 DIAGNOSIS — M25461 Effusion, right knee: Secondary | ICD-10-CM | POA: Diagnosis not present

## 2016-01-24 DIAGNOSIS — M25661 Stiffness of right knee, not elsewhere classified: Secondary | ICD-10-CM | POA: Diagnosis not present

## 2016-01-24 DIAGNOSIS — M25561 Pain in right knee: Secondary | ICD-10-CM | POA: Diagnosis not present

## 2016-01-24 DIAGNOSIS — R262 Difficulty in walking, not elsewhere classified: Secondary | ICD-10-CM | POA: Diagnosis not present

## 2016-04-21 ENCOUNTER — Encounter: Payer: Self-pay | Admitting: Family

## 2016-04-21 ENCOUNTER — Telehealth: Payer: Self-pay | Admitting: Family

## 2016-04-21 ENCOUNTER — Ambulatory Visit (INDEPENDENT_AMBULATORY_CARE_PROVIDER_SITE_OTHER): Payer: Medicare Other | Admitting: Family

## 2016-04-21 VITALS — BP 108/78 | HR 79 | Temp 97.7°F | Wt 181.6 lb

## 2016-04-21 DIAGNOSIS — J0101 Acute recurrent maxillary sinusitis: Secondary | ICD-10-CM

## 2016-04-21 DIAGNOSIS — J320 Chronic maxillary sinusitis: Secondary | ICD-10-CM | POA: Diagnosis not present

## 2016-04-21 DIAGNOSIS — Z1239 Encounter for other screening for malignant neoplasm of breast: Secondary | ICD-10-CM | POA: Diagnosis not present

## 2016-04-21 MED ORDER — AMOXICILLIN-POT CLAVULANATE ER 1000-62.5 MG PO TB12: 2.0000 | ORAL_TABLET | Freq: Two times a day (BID) | ORAL | 0 refills | Status: DC

## 2016-04-21 NOTE — Telephone Encounter (Signed)
Chris from [facility] called about the Rx that was sent over for Augmentin XR. They're out of it right now. He said they have 875 and 500 in the Regular Strength. Please give him a call to advise on how to move forward with this Rx.  Thayer OhmChris' ph# [phone] Thank you.

## 2016-04-21 NOTE — Patient Instructions (Signed)
Pleasure meeting you.   Look forward to seeing your for physical.   Increase intake of clear fluids. Congestion is best treated by hydration, when mucus is wetter, it is thinner, less sticky, and easier to expel from the body, either through coughing up drainage, or by blowing your nose.   Get plenty of rest.   Use saline nasal drops and blow your nose frequently. Run a humidifier at night and elevate the head of the bed. Vicks Vapor rub will help with congestion and cough. Steam showers and sinus massage for congestion.   Use Acetaminophen or Ibuprofen as needed for fever or pain. Avoid second hand smoke. Even the smallest exposure will worsen symptoms.   Over the counter medications you can try include Delsym for cough, a decongestant for congestion, and Mucinex or Robitussin as an expectorant. Be sure to just get the plain Mucinex or Robitussin that just has one medication (Guaifenesen). We don't recommend the combination products. Note, be sure to drink two glasses of water with each dose of Mucinex as the medication will not work well without adequate hydration.   You can also try a teaspoon of honey to see if this will help reduce cough. Throat lozenges can sometimes be beneficial as well.    This illness will typically last 7 - 10 days.   Please follow up with our clinic if you develop a fever greater than 101 F, symptoms worsen, or do not resolve in the next week.

## 2016-04-21 NOTE — Telephone Encounter (Signed)
Please clarify, thanks!

## 2016-04-21 NOTE — Telephone Encounter (Signed)
Thayer OhmChris called back from the pharmacy saying they can order the Augmentin XR and it should arrive tomorrow but the quantity doesn't match. He thinks M. Arnett wants the 7 day course but the qty prescribed would be a 3 1/2 day supply. Please give him a call regarding this.  Pt's ph# [phone] Thank you.

## 2016-04-21 NOTE — Telephone Encounter (Signed)
Please call Thayer OhmChris at pharmacy and let him know I made change to med.  I have 7 day supply so a total of 28 pills.

## 2016-04-21 NOTE — Progress Notes (Signed)
Pre visit review using our clinic review tool, if applicable. No additional management support is needed unless otherwise documented below in the visit note. 

## 2016-04-21 NOTE — Progress Notes (Signed)
Subjective:    Patient ID: Karen Vaughn, female    DOB: 02/09/1951, 65 y.o.   MRN: [id number]  CC: Karen Vaughn is a 65 y.o. female who presents today for an acute visit.    HPI: Patient here for evaluation of chronic sinus tenderness and congestion, worsening over the past week. Low grade fever. Endorses dizziness. No severe HA, nausea, vomiting, vision changes, chills. Tried sudafed without relief. Augmentin works well. No seasonal allergies. Humidity triggers sinus congestion.   Sinusitis- at 65 years old, starting having PCN injections due to recurrent sinusitis and secondary infection of PNA. Had tonsils and adenoid removed decades ago.   Due for mammogram. Mother has h/o ovarian cancer.   Due for CPE.      HISTORY:  Past Medical History:  Diagnosis Date  . Atrophic vaginitis   . Hypertension   . Menopause    After TAH, premarin 2-3 years, now occasional hot flashes  . Overweight(278.02)   . PONV (postoperative nausea and vomiting)   . Sinusitis    Past Surgical History:  Procedure Laterality Date  . KNEE ARTHROSCOPY Left 07/06/14   Dr. Magnus IvanBlackman  torn meniscus   . TONSILLECTOMY AND ADENOIDECTOMY    . TOTAL ABDOMINAL HYSTERECTOMY W/ BILATERAL SALPINGOOPHORECTOMY  1991  . TOTAL KNEE ARTHROPLASTY Right 12/13/2015   Procedure: RIGHT TOTAL KNEE ARTHROPLASTY;  Surgeon: Kathryne Hitchhristopher Y Blackman, MD;  [facility];  Service: Orthopedics;  Laterality: Right;   Family History  Problem Relation Age of Onset  . Ovarian cancer Mother   . Emphysema Father   . Heart disease Maternal Aunt   . Stroke Maternal Aunt   . Breast cancer Paternal Grandmother     Allergies: Review of patient's allergies indicates no active allergies. Current Outpatient Prescriptions on File Prior to Visit  Medication Sig Dispense Refill  . amLODipine (NORVASC) 2.5 MG tablet Take 1 tablet (2.5 mg total) by mouth daily. 90 tablet 3  . aspirin 81 MG tablet Take 81 mg by mouth daily.    . calcium  carbonate (TUMS - DOSED IN MG ELEMENTAL CALCIUM) 500 MG chewable tablet Chew 1-4 tablets by mouth at bedtime as needed for indigestion or heartburn.    . docusate sodium (COLACE) 100 MG capsule Take 100 mg by mouth daily.    . hydrochlorothiazide (HYDRODIURIL) 25 MG tablet Take 1 tablet (25 mg total) by mouth daily. 90 tablet 3  . Melatonin 3 MG TABS Take 9 mg by mouth at bedtime.    Marland Kitchen. omeprazole (PRILOSEC) 20 MG capsule Take 20 mg by mouth daily.     No current facility-administered medications on file prior to visit.     Social History  Substance Use Topics  . Smoking status: Never Smoker  . Smokeless tobacco: Not on file  . Alcohol use Yes     Comment: Rarely    Review of Systems  Constitutional: Negative for chills and fever.  HENT: Positive for congestion and sinus pressure. Negative for ear pain and sore throat.   Respiratory: Negative for cough, shortness of breath and wheezing.   Cardiovascular: Negative for chest pain and palpitations.  Gastrointestinal: Negative for abdominal distention, abdominal pain, nausea and vomiting.  Neurological: Positive for dizziness. Negative for syncope.      Objective:    BP 108/78   Pulse 79   Temp 97.7 F (36.5 C) (Oral)   Wt 181 lb 9.6 oz (82.4 kg)   SpO2 96%   BMI 32.17 kg/m  Physical Exam  Constitutional: She appears well-developed and well-nourished.  HENT:  Head: Normocephalic and atraumatic.  Right Ear: Hearing, tympanic membrane, external ear and ear canal normal. No drainage, swelling or tenderness. No foreign bodies. Tympanic membrane is not erythematous and not bulging. No middle ear effusion. No decreased hearing is noted.  Left Ear: Hearing, tympanic membrane, external ear and ear canal normal. No drainage, swelling or tenderness. No foreign bodies. Tympanic membrane is not erythematous and not bulging.  No middle ear effusion. No decreased hearing is noted.  Nose: No rhinorrhea. Right sinus exhibits maxillary sinus  tenderness. Right sinus exhibits no frontal sinus tenderness. Left sinus exhibits maxillary sinus tenderness. Left sinus exhibits no frontal sinus tenderness.  Mouth/Throat: Uvula is midline, oropharynx is clear and moist and mucous membranes are normal. No oropharyngeal exudate, posterior oropharyngeal edema, posterior oropharyngeal erythema or tonsillar abscesses.  Eyes: Conjunctivae are normal.  Cardiovascular: Regular rhythm, normal heart sounds and normal pulses.   Pulmonary/Chest: Effort normal and breath sounds normal. She has no wheezes. She has no rhonchi. She has no rales.  Lymphadenopathy:       Head (right side): No submental, no submandibular, no tonsillar, no preauricular, no posterior auricular and no occipital adenopathy present.       Head (left side): No submental, no submandibular, no tonsillar, no preauricular, no posterior auricular and no occipital adenopathy present.    She has no cervical adenopathy.  Neurological: She is alert.  Skin: Skin is warm and dry.  Psychiatric: She has a normal mood and affect. Her speech is normal and behavior is normal. Thought content normal.  Vitals reviewed.      Assessment & Plan:  1. Chronic maxillary sinusitis Suspect bacterial sinusitis due to duration and tenderness of maxillary sinuses. Return precautions given.   - amoxicillin-clavulanate (AUGMENTIN XR) 1000-62.5 MG 12 hr tablet; Take 2 tablets by mouth 2 (two) times daily.  Dispense: 14 tablet; Refill: 0  2. Screening for breast cancer  - MM DIGITAL SCREENING BILATERAL; Future     I have discontinued [name]'s Pediatric Multivitamins-Fl (POLYVITES/FLUORIDE PO), Calcium-Magnesium-Zinc, methocarbamol, rivaroxaban, and traMADol. I am also having her maintain her omeprazole, aspirin, amLODipine, hydrochlorothiazide, docusate sodium, calcium carbonate, Melatonin, and multivitamin.   Meds ordered this encounter  Medications  . Multiple Vitamin (MULTIVITAMIN) tablet    Sig:  Take 1 tablet by mouth daily.    Return precautions given.   Risks, benefits, and alternatives of the medications and treatment plan prescribed today were discussed, and patient expressed understanding.   Education regarding symptom management and diagnosis given to patient on AVS.  Continue to follow with Rennie Plowman, FNP for routine health maintenance.   Karen Ishikawa and I agreed with plan.   Rennie Plowman, FNP

## 2016-04-21 NOTE — Telephone Encounter (Signed)
Please advise, patient was seen today? Thanks

## 2016-04-22 ENCOUNTER — Encounter: Payer: Self-pay | Admitting: Family

## 2016-04-22 MED ORDER — AMOXICILLIN-POT CLAVULANATE 500-125 MG PO TABS: 1.0000 | ORAL_TABLET | Freq: Three times a day (TID) | ORAL | 0 refills | Status: DC

## 2016-04-22 NOTE — Telephone Encounter (Signed)
Left a detailed message for Thayer OhmChris at the [facility].  With message below. thanks

## 2016-05-13 ENCOUNTER — Ambulatory Visit: Payer: Medicare Other

## 2016-07-17 ENCOUNTER — Encounter (INDEPENDENT_AMBULATORY_CARE_PROVIDER_SITE_OTHER): Payer: Self-pay | Admitting: Orthopaedic Surgery

## 2016-08-27 ENCOUNTER — Ambulatory Visit (INDEPENDENT_AMBULATORY_CARE_PROVIDER_SITE_OTHER): Payer: Self-pay | Admitting: Orthopaedic Surgery

## 2016-08-27 DIAGNOSIS — M1712 Unilateral primary osteoarthritis, left knee: Secondary | ICD-10-CM

## 2016-08-27 DIAGNOSIS — G8929 Other chronic pain: Secondary | ICD-10-CM

## 2016-08-27 DIAGNOSIS — M25562 Pain in left knee: Secondary | ICD-10-CM

## 2016-08-27 NOTE — Progress Notes (Signed)
The patient is here for a scheduled Synvisc injection in her left knee. She has mild arthritis in that knee. She is also a month's out from her right total knee replacement she exhibits doing well.  On examination right knee her incisions well-healed range of motions for easily stable. Her left knee shows just a mild effusion with good range of motion. Of note she is working with a Systems analystpersonal trainer and doing great. She's increased her activities and lost weight as well.  She tolerated the Synvisc 1 injection in her left knee well. We'll see her back in 4 months to see how she doing overall. I would like an AP and lateral of her right operative knee at that visit and hopefully on the standing AP that showed the left knee as well.

## 2016-09-15 ENCOUNTER — Encounter: Payer: Self-pay | Admitting: Family

## 2016-09-16 ENCOUNTER — Telehealth: Payer: Self-pay | Admitting: Family

## 2016-09-16 DIAGNOSIS — H9190 Unspecified hearing loss, unspecified ear: Secondary | ICD-10-CM

## 2016-09-16 NOTE — Telephone Encounter (Signed)
ent referral

## 2016-10-01 DIAGNOSIS — H903 Sensorineural hearing loss, bilateral: Secondary | ICD-10-CM | POA: Diagnosis not present

## 2016-10-09 ENCOUNTER — Encounter: Payer: Self-pay | Admitting: Family

## 2016-10-09 ENCOUNTER — Ambulatory Visit (INDEPENDENT_AMBULATORY_CARE_PROVIDER_SITE_OTHER): Payer: Medicare HMO | Admitting: Family

## 2016-10-09 VITALS — BP 106/66 | HR 105 | Temp 98.1°F | Ht 63.0 in | Wt 183.0 lb

## 2016-10-09 DIAGNOSIS — Z0000 Encounter for general adult medical examination without abnormal findings: Secondary | ICD-10-CM

## 2016-10-09 MED ORDER — PNEUMOCOCCAL VAC POLYVALENT 25 MCG/0.5ML IJ INJ: 0.5000 mL | INJECTION | Freq: Once | INTRAMUSCULAR | Status: DC

## 2016-10-09 NOTE — Progress Notes (Signed)
Subjective:    Patient ID: Karen IshikawaBarbara R Vaughn, female    DOB: Jul 28, 1951, 66 y.o.   MRN: [id number]  CC: Karen Vaughn is a 66 y.o. female who presents today for physical exam.    HPI: Feeling well.       Colorectal Cancer Screening: UTD , 6 years ago; believes 10 year. No polyps. No family history.  Breast Cancer Screening: Mammogram due;  Cervical Cancer Screening: No longer does pap smear; h/o hysterectomy.  no h/o GYN cancer. No vaginal bleeding.  Bone Health screening/DEXA for 65+: No increased fracture risk. Defer screening at this time. Has diet high in calcium.  Lung Cancer Screening: Doesn't have 30 year pack year history and age > 55 years       Tetanus - utd        Pneumococcal - Candidate for.  Hepatitis C screening - Candidate for Labs: Screening labs today. Exercise: Gets regular exercise; has trainer now. Getting HR 'up' and CP, SOB. Alcohol use: rarely Smoking/tobacco use: Nonsmoker.  Regular dental exams: UTD Wears seat belt: Yes. Skin : new skin lesion on forehead.   HISTORY:  Past Medical History:  Diagnosis Date  . Atrophic vaginitis   . Hypertension   . Menopause    After TAH, premarin 2-3 years, now occasional hot flashes  . Overweight(278.02)   . PONV (postoperative nausea and vomiting)   . Sinusitis     Past Surgical History:  Procedure Laterality Date  . KNEE ARTHROSCOPY Left 07/06/14   Dr. Magnus IvanBlackman  torn meniscus   . TONSILLECTOMY AND ADENOIDECTOMY    . TOTAL ABDOMINAL HYSTERECTOMY W/ BILATERAL SALPINGOOPHORECTOMY  1991  . TOTAL KNEE ARTHROPLASTY Right 12/13/2015   Procedure: RIGHT TOTAL KNEE ARTHROPLASTY;  Surgeon: Kathryne Hitchhristopher Y Blackman, MD;  [facility];  Service: Orthopedics;  Laterality: Right;   Family History  Problem Relation Age of Onset  . Ovarian cancer Mother   . Emphysema Father   . Heart disease Maternal Aunt   . Stroke Maternal Aunt   . Breast cancer Paternal Grandmother       ALLERGIES: Patient has no active  allergies.  Current Outpatient Prescriptions on File Prior to Visit  Medication Sig Dispense Refill  . amLODipine (NORVASC) 2.5 MG tablet Take 1 tablet (2.5 mg total) by mouth daily. 90 tablet 3  . aspirin 81 MG tablet Take 81 mg by mouth daily.    . calcium carbonate (TUMS - DOSED IN MG ELEMENTAL CALCIUM) 500 MG chewable tablet Chew 1-4 tablets by mouth at bedtime as needed for indigestion or heartburn.    . docusate sodium (COLACE) 100 MG capsule Take 100 mg by mouth daily.    . hydrochlorothiazide (HYDRODIURIL) 25 MG tablet Take 1 tablet (25 mg total) by mouth daily. 90 tablet 3  . Melatonin 3 MG TABS Take 9 mg by mouth at bedtime.    . Multiple Vitamin (MULTIVITAMIN) tablet Take 1 tablet by mouth daily.    Marland Kitchen. omeprazole (PRILOSEC) 20 MG capsule Take 20 mg by mouth daily.     No current facility-administered medications on file prior to visit.     Social History  Substance Use Topics  . Smoking status: Never Smoker  . Smokeless tobacco: Never Used  . Alcohol use Yes     Comment: Rarely    Review of Systems  Constitutional: Negative for chills, fever and unexpected weight change.  HENT: Negative for congestion.   Respiratory: Negative for cough.   Cardiovascular: Negative for chest  pain, palpitations and leg swelling.  Gastrointestinal: Negative for nausea and vomiting.  Musculoskeletal: Negative for arthralgias and myalgias.  Skin: Negative for rash.  Neurological: Negative for headaches.  Hematological: Negative for adenopathy.  Psychiatric/Behavioral: Negative for confusion.      Objective:    BP 106/66   Pulse (!) 105   Temp 98.1 F (36.7 C) (Oral)   Ht 5\' 3"  (1.6 m)   Wt 183 lb (83 kg)   SpO2 97%   BMI 32.42 kg/m   BP Readings from Last 3 Encounters:  10/09/16 106/66  04/21/16 108/78  12/15/15 113/61   Wt Readings from Last 3 Encounters:  10/09/16 183 lb (83 kg)  04/21/16 181 lb 9.6 oz (82.4 kg)  12/13/15 196 lb (88.9 kg)    Physical Exam    Constitutional: She appears well-developed and well-nourished.  Eyes: Conjunctivae are normal.  Neck: No thyroid mass and no thyromegaly present.  Cardiovascular: Normal rate, regular rhythm, normal heart sounds and normal pulses.   Pulmonary/Chest: Effort normal and breath sounds normal. She has no wheezes. She has no rhonchi. She has no rales. Right breast exhibits no inverted nipple, no mass, no nipple discharge, no skin change and no tenderness. Left breast exhibits no inverted nipple, no mass, no nipple discharge, no skin change and no tenderness. Breasts are symmetrical.  CBE performed.   Lymphadenopathy:       Head (right side): No submental, no submandibular, no tonsillar, no preauricular, no posterior auricular and no occipital adenopathy present.       Head (left side): No submental, no submandibular, no tonsillar, no preauricular, no posterior auricular and no occipital adenopathy present.    She has no cervical adenopathy.       Right cervical: No superficial cervical, no deep cervical and no posterior cervical adenopathy present.      Left cervical: No superficial cervical, no deep cervical and no posterior cervical adenopathy present.    She has no axillary adenopathy.  Neurological: She is alert.  Skin: Skin is warm and dry.  Psychiatric: She has a normal mood and affect. Her speech is normal and behavior is normal. Thought content normal.  Vitals reviewed.      Assessment & Plan:   Problem List Items Addressed This Visit      Other   Routine physical examination - Primary    Up-to-date on colonoscopy. Patient stated no polyps or family history of colon cancer and states 10 year interval. Her mammogram is due. Patient will schedule this. I advised her to have a 3-D mammogram due to dense breasts. She no longer does Pap smear as a history of hysterectomy. No vaginal complaints today and she politely declines a pelvic exam. No increased fracture risk. Patient politely  declines DEXA scan at this time. Nonsmoker. Candidate for hepatitis C screening which was consented for and included with regular screening labs. Pneumococcal 23 booster given. Congratulated patient on regular exercise. New skin lesion noted on forehead, since it is in the sun exposed area, we jointly agreed to consult dermatology.      Relevant Medications   pneumococcal 23 valent vaccine (PNU-IMMUNE) injection 0.5 mL   Other Relevant Orders   MM DIGITAL SCREENING BILATERAL   CBC with Differential/Platelet   Comprehensive metabolic panel   Hemoglobin A1c   Hepatitis C antibody   Lipid panel   TSH   VITAMIN D 25 Hydroxy (Vit-D Deficiency, Fractures)   Ambulatory referral to Dermatology  I have discontinued Ms. Wilmeth's amoxicillin-clavulanate. I am also having her maintain her omeprazole, aspirin, amLODipine, hydrochlorothiazide, docusate sodium, calcium carbonate, Melatonin, and multivitamin. We will continue to administer pneumococcal 23 valent vaccine.   Meds ordered this encounter  Medications  . pneumococcal 23 valent vaccine (PNU-IMMUNE) injection 0.5 mL    Return precautions given.   Risks, benefits, and alternatives of the medications and treatment plan prescribed today were discussed, and patient expressed understanding.   Education regarding symptom management and diagnosis given to patient on AVS.   Continue to follow with Rennie Plowman, FNP for routine health maintenance.   Karen Ishikawa and I agreed with plan.   Rennie Plowman, FNP

## 2016-10-09 NOTE — Assessment & Plan Note (Signed)
Up-to-date on colonoscopy. Patient stated no polyps or family history of colon cancer and states 10 year interval. Her mammogram is due. Patient will schedule this. I advised her to have a 3-D mammogram due to dense breasts. She no longer does Pap smear as a history of hysterectomy. No vaginal complaints today and she politely declines a pelvic exam. No increased fracture risk. Patient politely declines DEXA scan at this time. Nonsmoker. Candidate for hepatitis C screening which was consented for and included with regular screening labs. Pneumococcal 23 booster given. Congratulated patient on regular exercise. New skin lesion noted on forehead, since it is in the sun exposed area, we jointly agreed to consult dermatology.

## 2016-10-09 NOTE — Progress Notes (Signed)
Pre visit review using our clinic review tool, if applicable. No additional management support is needed unless otherwise documented below in the visit note. 

## 2016-10-09 NOTE — Patient Instructions (Addendum)
Pleasure seeing you.  Labs - make fasting appt  3d mammogram  We placed a referral. Mammogram this year. I asked that you call one the below locations and schedule this when it is convenient for you.   If you have dense breasts, you may ask for 3D mammogram over the traditional 2D mammogram as new evidence suggest 3D is superior. Please note that NOT all insurance companies cover 3D and you may have to pay a higher copay. You may call your insurance company to further clarify your benefits.   Options for [place]  [place], [place]  * [facility], [place] * Note if you ask for 3D mammogram at this location, you must request [place], [place] location*      Health Maintenance for Postmenopausal Women Introduction Menopause is a normal process in which your reproductive ability comes to an end. This process happens gradually over a span of months to years, usually between the ages of 35 and 28. Menopause is complete when you have missed 12 consecutive menstrual periods. It is important to talk with your health care provider about some of the most common conditions that affect postmenopausal women, such as heart disease, cancer, and bone loss (osteoporosis). Adopting a healthy lifestyle and getting preventive care can help to promote your health and wellness. Those actions can also lower your chances of developing some of these common conditions. What should I know about menopause? During menopause, you may experience a number of symptoms, such as:  Moderate-to-severe hot flashes.  Night sweats.  Decrease in sex drive.  Mood swings.  Headaches.  Tiredness.  Irritability.  Memory problems.  Insomnia. Choosing to treat or not to treat menopausal changes is an individual decision that you make with your health care  provider. What should I know about hormone replacement therapy and supplements? Hormone therapy products are effective for treating symptoms that are associated with menopause, such as hot flashes and night sweats. Hormone replacement carries certain risks, especially as you become older. If you are thinking about using estrogen or estrogen with progestin treatments, discuss the benefits and risks with your health care provider. What should I know about heart disease and stroke? Heart disease, heart attack, and stroke become more likely as you age. This may be due, in part, to the hormonal changes that your body experiences during menopause. These can affect how your body processes dietary fats, triglycerides, and cholesterol. Heart attack and stroke are both medical emergencies. There are many things that you can do to help prevent heart disease and stroke:  Have your blood pressure checked at least every 1-2 years. High blood pressure causes heart disease and increases the risk of stroke.  If you are 81-55 years old, ask your health care provider if you should take aspirin to prevent a heart attack or a stroke.  Do not use any tobacco products, including cigarettes, chewing tobacco, or electronic cigarettes. If you need help quitting, ask your health care provider.  It is important to eat a healthy diet and maintain a healthy weight.  Be sure to include plenty of vegetables, fruits, low-fat dairy products, and lean protein.  Avoid eating foods that are high in solid fats, added sugars, or salt (sodium).  Get regular exercise. This is one of the most important things that you can do for your health.  Try to exercise for at least 150 minutes each week. The type of exercise that you do should increase your heart rate and make you sweat. This is known as moderate-intensity exercise.  Try to do strengthening exercises at least twice each week. Do these in addition to the moderate-intensity  exercise.  Know your numbers.Ask your health care provider to check your cholesterol and your blood glucose. Continue to have your blood tested as directed by your health care provider. What should I know about cancer screening? There are several types of cancer. Take the following steps to reduce your risk and to catch any cancer development as early as possible. Breast Cancer  Practice breast self-awareness.  This means understanding how your breasts normally appear and feel.  It also means doing regular breast self-exams. Let your health care provider know about any changes, no matter how small.  If you are 40 or older, have a clinician do a breast exam (clinical breast exam or CBE) every year. Depending on your age, family history, and medical history, it may be recommended that you also have a yearly breast X-ray (mammogram).  If you have a family history of breast cancer, talk with your health care provider about genetic screening.  If you are at high risk for breast cancer, talk with your health care provider about having an MRI and a mammogram every year.  Breast cancer (BRCA) gene test is recommended for women who have family members with BRCA-related cancers. Results of the assessment will determine the need for genetic counseling and BRCA1 and for BRCA2 testing. BRCA-related cancers include these types:  Breast. This occurs in males or females.  Ovarian.  Tubal. This may also be called fallopian tube cancer.  Cancer of the abdominal or pelvic lining (peritoneal cancer).  Prostate.  Pancreatic. Cervical, Uterine, and Ovarian Cancer  Your health care provider may recommend that you be screened regularly for cancer of the pelvic organs. These include your ovaries, uterus, and vagina. This screening involves a pelvic exam, which includes checking for microscopic changes to the surface of your cervix (Pap test).  For women ages 21-65, health care providers may recommend a  pelvic exam and a Pap test every three years. For women ages 65-65, they may recommend the Pap test and pelvic exam, combined with testing for human papilloma virus (HPV), every five years. Some types of HPV increase your risk of cervical cancer. Testing for HPV may also be done on women of any age who have unclear Pap test results.  Other health care providers may not recommend any screening for nonpregnant women who are considered low risk for pelvic cancer and have no symptoms. Ask your health care provider if a screening pelvic exam is right for you.  If you have had past treatment for cervical cancer or a condition that could lead to cancer, you need Pap tests and screening for cancer for at least 20 years after your treatment. If Pap tests have been discontinued for you, your risk factors (such as having a new sexual partner) need to be reassessed to determine if you should start having screenings again. Some women have medical problems that increase the chance of getting cervical cancer. In these cases, your health care provider may recommend that you have screening and Pap tests more often.  If you have a family history of uterine cancer or ovarian cancer, talk with your health care provider about genetic screening.  If you have vaginal bleeding after reaching menopause, tell  your health care provider.  There are currently no reliable tests available to screen for ovarian cancer. Lung Cancer  Lung cancer screening is recommended for adults 43-30 years old who are at high risk for lung cancer because of a history of smoking. A yearly low-dose CT scan of the lungs is recommended if you:  Currently smoke.  Have a history of at least 30 pack-years of smoking and you currently smoke or have quit within the past 15 years. A pack-year is smoking an average of one pack of cigarettes per day for one year. Yearly screening should:  Continue until it has been 15 years since you quit.  Stop if you  develop a health problem that would prevent you from having lung cancer treatment. Colorectal Cancer  This type of cancer can be detected and can often be prevented.  Routine colorectal cancer screening usually begins at age 32 and continues through age 35.  If you have risk factors for colon cancer, your health care provider may recommend that you be screened at an earlier age.  If you have a family history of colorectal cancer, talk with your health care provider about genetic screening.  Your health care provider may also recommend using home test kits to check for hidden blood in your stool.  A small camera at the end of a tube can be used to examine your colon directly (sigmoidoscopy or colonoscopy). This is done to check for the earliest forms of colorectal cancer.  Direct examination of the colon should be repeated every 5-10 years until age 44. However, if early forms of precancerous polyps or small growths are found or if you have a family history or genetic risk for colorectal cancer, you may need to be screened more often. Skin Cancer  Check your skin from head to toe regularly.  Monitor any moles. Be sure to tell your health care provider:  About any new moles or changes in moles, especially if there is a change in a mole's shape or color.  If you have a mole that is larger than the size of a pencil eraser.  If any of your family members has a history of skin cancer, especially at a young age, talk with your health care provider about genetic screening.  Always use sunscreen. Apply sunscreen liberally and repeatedly throughout the day.  Whenever you are outside, protect yourself by wearing long sleeves, pants, a wide-brimmed hat, and sunglasses. What should I know about osteoporosis? Osteoporosis is a condition in which bone destruction happens more quickly than new bone creation. After menopause, you may be at an increased risk for osteoporosis. To help prevent  osteoporosis or the bone fractures that can happen because of osteoporosis, the following is recommended:  If you are 9-7 years old, get at least 1,000 mg of calcium and at least 600 mg of vitamin D per day.  If you are older than age 61 but younger than age 7, get at least 1,200 mg of calcium and at least 600 mg of vitamin D per day.  If you are older than age 89, get at least 1,200 mg of calcium and at least 800 mg of vitamin D per day. Smoking and excessive alcohol intake increase the risk of osteoporosis. Eat foods that are rich in calcium and vitamin D, and do weight-bearing exercises several times each week as directed by your health care provider. What should I know about how menopause affects my mental health? Depression may occur at any  age, but it is more common as you become older. Common symptoms of depression include:  Low or sad mood.  Changes in sleep patterns.  Changes in appetite or eating patterns.  Feeling an overall lack of motivation or enjoyment of activities that you previously enjoyed.  Frequent crying spells. Talk with your health care provider if you think that you are experiencing depression. What should I know about immunizations? It is important that you get and maintain your immunizations. These include:  Tetanus, diphtheria, and pertussis (Tdap) booster vaccine.  Influenza every year before the flu season begins.  Pneumonia vaccine.  Shingles vaccine. Your health care provider may also recommend other immunizations. This information is not intended to replace advice given to you by your health care provider. Make sure you discuss any questions you have with your health care provider. Document Released: 10/03/2005 Document Revised: 02/29/2016 Document Reviewed: 05/15/2015  2017 Elsevier

## 2016-10-10 DIAGNOSIS — Z20828 Contact with and (suspected) exposure to other viral communicable diseases: Secondary | ICD-10-CM

## 2016-10-10 NOTE — Progress Notes (Signed)
Unable to reach patient on phone. I will be sending patient a mychart message.

## 2016-10-13 MED ORDER — OSELTAMIVIR PHOSPHATE 75 MG PO CAPS: 75.0000 mg | ORAL_CAPSULE | Freq: Two times a day (BID) | ORAL | 0 refills | Status: DC

## 2016-10-13 NOTE — Telephone Encounter (Signed)
Diagnosed husband with flu;

## 2016-10-14 ENCOUNTER — Other Ambulatory Visit (INDEPENDENT_AMBULATORY_CARE_PROVIDER_SITE_OTHER): Payer: Medicare HMO

## 2016-10-14 ENCOUNTER — Ambulatory Visit (INDEPENDENT_AMBULATORY_CARE_PROVIDER_SITE_OTHER): Payer: Medicare HMO

## 2016-10-14 DIAGNOSIS — Z23 Encounter for immunization: Secondary | ICD-10-CM

## 2016-10-14 DIAGNOSIS — Z0000 Encounter for general adult medical examination without abnormal findings: Secondary | ICD-10-CM | POA: Diagnosis not present

## 2016-10-14 LAB — COMPREHENSIVE METABOLIC PANEL
ALBUMIN: 3.9 g/dL (ref 3.5–5.2)
ALK PHOS: 67 U/L (ref 39–117)
ALT: 21 U/L (ref 0–35)
AST: 19 U/L (ref 0–37)
BILIRUBIN TOTAL: 0.3 mg/dL (ref 0.2–1.2)
BUN: 18 mg/dL (ref 6–23)
CO2: 30 mEq/L (ref 19–32)
Calcium: 9.7 mg/dL (ref 8.4–10.5)
Chloride: 104 mEq/L (ref 96–112)
Creatinine, Ser: 0.76 mg/dL (ref 0.40–1.20)
GFR: 80.56 mL/min (ref >60.00)
GLUCOSE: 101 mg/dL — AB (ref 70–99)
POTASSIUM: 4.1 meq/L (ref 3.5–5.1)
SODIUM: 139 meq/L (ref 135–145)
TOTAL PROTEIN: 6.3 g/dL (ref 6.0–8.3)

## 2016-10-14 LAB — TSH: TSH: 3.07 u[IU]/mL (ref 0.35–4.50)

## 2016-10-14 LAB — VITAMIN D 25 HYDROXY (VIT D DEFICIENCY, FRACTURES): VITD: 22.11 ng/mL — ABNORMAL LOW (ref 30.00–100.00)

## 2016-10-14 LAB — CBC WITH DIFFERENTIAL/PLATELET
BASOS ABS: 0.1 10*3/uL (ref 0.0–0.1)
Basophils Relative: 1.1 % (ref 0.0–3.0)
EOS ABS: 0.2 10*3/uL (ref 0.0–0.7)
Eosinophils Relative: 3.6 % (ref 0.0–5.0)
HCT: 37.4 % (ref 36.0–46.0)
HEMOGLOBIN: 12.3 g/dL (ref 12.0–15.0)
LYMPHS ABS: 1.9 10*3/uL (ref 0.7–4.0)
Lymphocytes Relative: 31.7 % (ref 12.0–46.0)
MCHC: 33 g/dL (ref 30.0–36.0)
MCV: 85.9 fl (ref 78.0–100.0)
MONO ABS: 0.5 10*3/uL (ref 0.1–1.0)
Monocytes Relative: 9 % (ref 3.0–12.0)
NEUTROS PCT: 54.6 % (ref 43.0–77.0)
Neutro Abs: 3.3 10*3/uL (ref 1.4–7.7)
Platelets: 266 10*3/uL (ref 150.0–400.0)
RBC: 4.36 Mil/uL (ref 3.87–5.11)
RDW: 13.6 % (ref 11.5–15.5)
WBC: 6 10*3/uL (ref 4.0–10.5)

## 2016-10-14 LAB — LIPID PANEL
CHOL/HDL RATIO: 3
Cholesterol: 165 mg/dL (ref 0–200)
HDL: 61.9 mg/dL (ref >39.00)
LDL Cholesterol: 85 mg/dL (ref 0–99)
NONHDL: 103.35
Triglycerides: 92 mg/dL (ref 0.0–149.0)
VLDL: 18.4 mg/dL (ref 0.0–40.0)

## 2016-10-14 LAB — HEMOGLOBIN A1C: Hgb A1c MFr Bld: 5.9 % (ref 4.6–6.5)

## 2016-10-14 NOTE — Progress Notes (Signed)
Patient comes in for Pneumococcal 23 vaccine.  Injected left deltoid. Patient tolerated injection well.

## 2016-10-15 ENCOUNTER — Encounter: Payer: Self-pay | Admitting: Family

## 2016-10-15 LAB — HEPATITIS C ANTIBODY: HCV Ab: NEGATIVE

## 2016-10-17 MED ORDER — AMLODIPINE BESYLATE 2.5 MG PO TABS: 2.5000 mg | ORAL_TABLET | Freq: Every day | ORAL | 3 refills | Status: DC

## 2016-10-17 MED ORDER — HYDROCHLOROTHIAZIDE 25 MG PO TABS: 25.0000 mg | ORAL_TABLET | Freq: Every day | ORAL | 3 refills | Status: DC

## 2016-11-11 ENCOUNTER — Encounter: Payer: Self-pay | Admitting: Family

## 2016-11-11 MED ORDER — AMLODIPINE BESYLATE 2.5 MG PO TABS: 2.5000 mg | ORAL_TABLET | Freq: Every day | ORAL | 3 refills | Status: DC

## 2016-11-11 MED ORDER — HYDROCHLOROTHIAZIDE 25 MG PO TABS: 25.0000 mg | ORAL_TABLET | Freq: Every day | ORAL | 3 refills | Status: DC

## 2016-11-28 ENCOUNTER — Telehealth: Payer: Self-pay

## 2016-11-28 NOTE — Telephone Encounter (Signed)
Unable to leave VM due to phone not accepting calls. Medication (Hydrochlorithizide) was placed at her primary pharmacy.

## 2016-12-19 ENCOUNTER — Telehealth: Payer: Self-pay

## 2016-12-19 ENCOUNTER — Other Ambulatory Visit: Payer: Self-pay

## 2016-12-19 MED ORDER — HYDROCHLOROTHIAZIDE 25 MG PO TABS: 25.0000 mg | ORAL_TABLET | Freq: Every day | ORAL | 2 refills | Status: DC

## 2016-12-19 MED ORDER — HYDROCHLOROTHIAZIDE 25 MG PO TABS: 25.0000 mg | ORAL_TABLET | Freq: Every day | ORAL | 3 refills | Status: DC

## 2016-12-22 NOTE — Telephone Encounter (Signed)
Error

## 2016-12-24 ENCOUNTER — Ambulatory Visit (INDEPENDENT_AMBULATORY_CARE_PROVIDER_SITE_OTHER): Payer: Medicare HMO | Admitting: Orthopaedic Surgery

## 2016-12-29 ENCOUNTER — Ambulatory Visit (INDEPENDENT_AMBULATORY_CARE_PROVIDER_SITE_OTHER): Payer: Medicare HMO | Admitting: Orthopaedic Surgery

## 2016-12-29 ENCOUNTER — Ambulatory Visit (INDEPENDENT_AMBULATORY_CARE_PROVIDER_SITE_OTHER): Payer: Medicare HMO

## 2016-12-29 DIAGNOSIS — Z96651 Presence of right artificial knee joint: Secondary | ICD-10-CM

## 2016-12-29 NOTE — Progress Notes (Signed)
The patient is 13 months out from a right total knee arthroplasty. We also placed a Synvisc 1 injection in her left knee back in January. Should both knees are doing well. She's been having some shoulder problems recently with working out she will be look at her shoulder today as well.  On examination of her knees both knees have fluid range of motion with no effusion. Her left knee hurts only on the medial joint line but no significant patellofemoral crepitation. She has a mild varus deformity easily correctable and her knee is ligamentously stable. The right knee patella tracks well. Her incisions well-healed. There is no effusion. Her range of motions full the knee is ligamentously stable. Examination of her right shoulder shows full range motion of her shoulder with positive Neer and Hawkins signs. Her rotator cuff shows excellent strength and normal function.  2 views of her right knee are obtained to see left knee on the films as well. Left knee does have medial joint space narrowing. The right knee shows a well-seated implant with no, getting features no evidence of loosening or osteolysis.  This point she'll hold off some of her exercise routine this is a relates to her shoulder. We can always inject her shoulder if it Bowsher enough any time. We also injected her left knee down the road if needed.

## 2017-01-08 ENCOUNTER — Other Ambulatory Visit: Payer: Self-pay | Admitting: Radiology

## 2017-01-08 MED ORDER — AMLODIPINE BESYLATE 2.5 MG PO TABS: 2.5000 mg | ORAL_TABLET | Freq: Every day | ORAL | 2 refills | Status: DC

## 2017-01-26 DIAGNOSIS — L821 Other seborrheic keratosis: Secondary | ICD-10-CM | POA: Diagnosis not present

## 2017-01-26 DIAGNOSIS — L820 Inflamed seborrheic keratosis: Secondary | ICD-10-CM | POA: Diagnosis not present

## 2017-01-26 DIAGNOSIS — L578 Other skin changes due to chronic exposure to nonionizing radiation: Secondary | ICD-10-CM | POA: Diagnosis not present

## 2017-04-08 ENCOUNTER — Ambulatory Visit (INDEPENDENT_AMBULATORY_CARE_PROVIDER_SITE_OTHER): Payer: Medicare HMO | Admitting: Orthopaedic Surgery

## 2017-04-08 ENCOUNTER — Encounter (INDEPENDENT_AMBULATORY_CARE_PROVIDER_SITE_OTHER): Payer: Self-pay

## 2017-04-16 ENCOUNTER — Encounter (INDEPENDENT_AMBULATORY_CARE_PROVIDER_SITE_OTHER): Payer: Self-pay | Admitting: Orthopaedic Surgery

## 2017-04-29 ENCOUNTER — Ambulatory Visit (INDEPENDENT_AMBULATORY_CARE_PROVIDER_SITE_OTHER): Payer: Medicare HMO | Admitting: Orthopaedic Surgery

## 2017-04-29 ENCOUNTER — Telehealth (INDEPENDENT_AMBULATORY_CARE_PROVIDER_SITE_OTHER): Payer: Self-pay

## 2017-04-29 DIAGNOSIS — M1712 Unilateral primary osteoarthritis, left knee: Secondary | ICD-10-CM

## 2017-04-29 MED ORDER — HYLAN G-F 20 48 MG/6ML IX SOSY
48.0000 mg | PREFILLED_SYRINGE | INTRA_ARTICULAR | Status: AC | PRN
  Administered 2017-04-29: 48 mg via INTRA_ARTICULAR

## 2017-04-29 NOTE — Progress Notes (Signed)
   Procedure Note  Patient: Little IshikawaBarbara R Fager             Date of Birth: 1950-10-02           MRN: [id number]             Visit Date: 04/29/2017  Procedures: Visit Diagnoses: Unilateral primary osteoarthritis, left knee  Large Joint Inj Date/Time: 04/29/2017 2:28 PM Performed by: Kathryne HitchBLACKMAN, Vernetta Dizdarevic Y Authorized by: Kathryne HitchBLACKMAN, Brisia Schuermann Y   Location:  Knee Site:  L knee Ultrasound Guidance: No   Fluoroscopic Guidance: No   Arthrogram: No   Medications:  48 mg Hylan 48 MG/6ML   The patient is here for a scheduled hyaluronic acid injection of Synvisc one her left needed treat her moderate osteoarthritis. She understands fully the risks and benefits of this injection and while be recommended this for her. She's had injection similar in the past. She is looking for this today. She is already tried a steroid injection in his work on Dance movement psychotherapistquad strengthening exercises. She has a history of her right total knee arthroplasty. This is her left knee today though.  On examination of her left knee she has excellent range of motion of that knee and no effusion. She tolerated the Synvisc 1 injection well. At this point she'll follow-up as needed understanding fully that she can have another injection like this in 6 months or a steroid injection in between if needed. All questions were encouraged and answered.

## 2017-04-29 NOTE — Telephone Encounter (Signed)
Synvisc One Authorized  [phone]#[id number]

## 2017-05-18 ENCOUNTER — Telehealth (INDEPENDENT_AMBULATORY_CARE_PROVIDER_SITE_OTHER): Payer: Self-pay | Admitting: Orthopaedic Surgery

## 2017-05-18 NOTE — Telephone Encounter (Signed)
I'm not sure what this means

## 2017-05-18 NOTE — Telephone Encounter (Signed)
Prior Authorization form/ Needs to verify shot given

## 2017-05-26 DIAGNOSIS — R69 Illness, unspecified: Secondary | ICD-10-CM | POA: Diagnosis not present

## 2017-06-02 ENCOUNTER — Telehealth (INDEPENDENT_AMBULATORY_CARE_PROVIDER_SITE_OTHER): Payer: Self-pay | Admitting: Orthopaedic Surgery

## 2017-07-08 ENCOUNTER — Encounter: Payer: Self-pay | Admitting: Family Medicine

## 2017-07-08 ENCOUNTER — Ambulatory Visit (INDEPENDENT_AMBULATORY_CARE_PROVIDER_SITE_OTHER): Payer: Medicare HMO | Admitting: Family Medicine

## 2017-07-08 VITALS — BP 120/88 | HR 89 | Temp 98.2°F | Wt 184.2 lb

## 2017-07-08 DIAGNOSIS — J0140 Acute pansinusitis, unspecified: Secondary | ICD-10-CM

## 2017-07-08 MED ORDER — AMOXICILLIN-POT CLAVULANATE 875-125 MG PO TABS: 1.0000 | ORAL_TABLET | Freq: Two times a day (BID) | ORAL | 0 refills | Status: DC

## 2017-07-08 NOTE — Progress Notes (Signed)
PCP: Allegra GranaArnett, Margaret G, FNP  Subjective:  Karen Vaughn is a 66 y.o. year old very pleasant female patient who presents with nasal congestion, sinus pressure/tenderness, post nasal drip, cough that is nonproductive -started: one month ago, symptoms are not improving  -previous treatments: pseudo fed has provided limited benefit. Nasal saline rinse provides limited benefit -sick contacts/travel/risks: denies flu exposure. No sick contact exposure, influenza is UTD -Hx of: HTN and sinusitis No recent antibiotic use  Sinusitis at 66 years old, PCN injections initiated due to recurrent sinusitis and secondary PNA. Tonsils and adenoids have been removed decades ago.  ROS-denies fever, ear pain, SOB, NVD, tooth pain  Pertinent Past Medical History- HTN  Medications- reviewed  Current Outpatient Medications  Medication Sig Dispense Refill  . aspirin 81 MG tablet Take 81 mg by mouth daily.    . calcium carbonate (TUMS - DOSED IN MG ELEMENTAL CALCIUM) 500 MG chewable tablet Chew 1-4 tablets by mouth at bedtime as needed for indigestion or heartburn.    . docusate sodium (COLACE) 100 MG capsule Take 100 mg by mouth daily.    . hydrochlorothiazide (HYDRODIURIL) 25 MG tablet Take 1 tablet (25 mg total) by mouth daily. 90 tablet 2  . Multiple Vitamin (MULTIVITAMIN) tablet Take 1 tablet by mouth daily.    Marland Kitchen. omeprazole (PRILOSEC) 20 MG capsule Take 20 mg by mouth daily.     No current facility-administered medications for this visit.     Objective: BP 120/88   Pulse 89   Temp 98.2 F (36.8 C) (Oral)   Wt 184 lb 3.2 oz (83.6 kg)   SpO2 97%   BMI 32.63 kg/m  Gen: NAD, resting comfortably HEENT: Turbinates erythematous, TM normal, pharynx mildly erythematous with no tonsilar exudate or edema, positive frontal and maxillary sinus tenderness CV: RRR no murmurs rubs or gallops Lungs: CTAB no crackles, wheeze, rhonchi Abdomen: soft/nontender/nondistended/normal bowel sounds. No rebound or  guarding.  Ext: no edema Skin: warm, dry, no rash Neuro: grossly normal, moves all extremities  Assessment/Plan:  1. Acute pansinusitis, recurrence not specified Symptoms have been present for one month and not improving. Will treat with Augmentin and supportive measures of rest, fluids, and acetaminophen as needed. Advised follow up if symptoms do not improve with treatment, worsen, or she develops a fever >101.   We discussed that we did not find any infection that had higher probability of being  pneumonia or strep throat.  Likely course of 1-2 weeks. Finally, we reviewed reasons to return to care including if symptoms worsen or persist or new concerns arise- once again particularly shortness of breath or fever.   Inez CatalinaJulia Ann Nikole Swartzentruber, FNP

## 2017-07-08 NOTE — Patient Instructions (Signed)
It was a pleasure to see you today!  Please take augmentin with food and follow up if symptoms do not improve in 3 to 4 days, worsen, or you develop a fever >100.   Sinusitis, Adult Sinusitis is soreness and inflammation of your sinuses. Sinuses are hollow spaces in the bones around your face. They are located:  Around your eyes.  In the middle of your forehead.  Behind your nose.  In your cheekbones.  Your sinuses and nasal passages are lined with a stringy fluid (mucus). Mucus normally drains out of your sinuses. When your nasal tissues get inflamed or swollen, the mucus can get trapped or blocked so air cannot flow through your sinuses. This lets bacteria, viruses, and funguses grow, and that leads to infection. Follow these instructions at home: Medicines  Take, use, or apply over-the-counter and prescription medicines only as told by your doctor. These may include nasal sprays.  If you were prescribed an antibiotic medicine, take it as told by your doctor. Do not stop taking the antibiotic even if you start to feel better. Hydrate and Humidify  Drink enough water to keep your pee (urine) clear or pale yellow.  Use a cool mist humidifier to keep the humidity level in your home above 50%.  Breathe in steam for 10-15 minutes, 3-4 times a day or as told by your doctor. You can do this in the bathroom while a hot shower is running.  Try not to spend time in cool or dry air. Rest  Rest as much as possible.  Sleep with your head raised (elevated).  Make sure to get enough sleep each night. General instructions  Put a warm, moist washcloth on your face 3-4 times a day or as told by your doctor. This will help with discomfort.  Wash your hands often with soap and water. If there is no soap and water, use hand sanitizer.  Do not smoke. Avoid being around people who are smoking (secondhand smoke).  Keep all follow-up visits as told by your doctor. This is important. Contact  a doctor if:  You have a fever.  Your symptoms get worse.  Your symptoms do not get better within 10 days. Get help right away if:  You have a very bad headache.  You cannot stop throwing up (vomiting).  You have pain or swelling around your face or eyes.  You have trouble seeing.  You feel confused.  Your neck is stiff.  You have trouble breathing. This information is not intended to replace advice given to you by your health care provider. Make sure you discuss any questions you have with your health care provider. Document Released: 01/28/2008 Document Revised: 04/06/2016 Document Reviewed: 06/06/2015 Elsevier Interactive Patient Education  [employer]2018 [employer]

## 2017-08-12 ENCOUNTER — Other Ambulatory Visit: Payer: Self-pay | Admitting: Family

## 2017-10-12 ENCOUNTER — Encounter: Payer: Medicare HMO | Admitting: Family

## 2017-10-13 ENCOUNTER — Encounter (INDEPENDENT_AMBULATORY_CARE_PROVIDER_SITE_OTHER): Payer: Self-pay | Admitting: Orthopaedic Surgery

## 2017-10-15 ENCOUNTER — Telehealth (INDEPENDENT_AMBULATORY_CARE_PROVIDER_SITE_OTHER): Payer: Self-pay | Admitting: Orthopaedic Surgery

## 2017-10-15 NOTE — Telephone Encounter (Signed)
Called patient to schedule injection in left knee. Per patient she is not ready to schedule injection yet. Patient wanted to schedule to have her right knee checked. Instead.

## 2017-10-19 ENCOUNTER — Encounter: Payer: Self-pay | Admitting: Family

## 2017-10-19 ENCOUNTER — Ambulatory Visit (INDEPENDENT_AMBULATORY_CARE_PROVIDER_SITE_OTHER): Payer: Medicare HMO | Admitting: Family

## 2017-10-19 VITALS — BP 118/84 | HR 80 | Temp 98.4°F | Ht 61.5 in | Wt 182.4 lb

## 2017-10-19 DIAGNOSIS — Z0000 Encounter for general adult medical examination without abnormal findings: Secondary | ICD-10-CM | POA: Diagnosis not present

## 2017-10-19 DIAGNOSIS — Z13820 Encounter for screening for osteoporosis: Secondary | ICD-10-CM

## 2017-10-19 DIAGNOSIS — I10 Essential (primary) hypertension: Secondary | ICD-10-CM

## 2017-10-19 NOTE — Assessment & Plan Note (Signed)
At goal. Continue current regimen. 

## 2017-10-19 NOTE — Assessment & Plan Note (Signed)
CBE performed. Declines mammogram. In absence of pelvic symptoms and h/o hysterectomy, deferred pelvic exam. Pending DEXA, cologuard. Congratulated patient on weight loss.

## 2017-10-19 NOTE — Patient Instructions (Addendum)
So proud of you!  Labs when fasting  Tdap due 07/2018  Cologuard  For calcium-   For post menopausal women, guidelines recommend a diet with 1200 mg of Calcium per day. If you are eating calcium rich foods, you do not need a calcium supplement. The body better absorbs the calcium that you eat over supplementation. If you do supplement, I recommend not supplementing the full 1200 mg/ day as this can lead to increased risk of cardiovascular disease. I recommend Calcium Citrate over the counter, and you may take a total of 600 to 800 mg per day in divided doses with meals for best absorption.   For bone health, you need adequate vitamin D, and I recommend you supplement as it is harder to do so with diet alone. I recommend cholecalciferol 800 units daily.  Also, please ensure you are following a diet high in calcium -- research shows better outcomes with dietary sources including kale, yogurt, broccolii, cheese, okra, almonds- to name a few.     Also remember that exercise is a great medicine for maintain and preserve bone health. Advise moderate exercise for 30 minutes , 3 times per week.     Health Maintenance, Female Adopting a healthy lifestyle and getting preventive care can go a long way to promote health and wellness. Talk with your health care provider about what schedule of regular examinations is right for you. This is a good chance for you to check in with your provider about disease prevention and staying healthy. In between checkups, there are plenty of things you can do on your own. Experts have done a lot of research about which lifestyle changes and preventive measures are most likely to keep you healthy. Ask your health care provider for more information. Weight and diet Eat a healthy diet  Be sure to include plenty of vegetables, fruits, low-fat dairy products, and lean protein.  Do not eat a lot of foods high in solid fats, added sugars, or salt.  Get regular  exercise. This is one of the most important things you can do for your health. ? Most adults should exercise for at least 150 minutes each week. The exercise should increase your heart rate and make you sweat (moderate-intensity exercise). ? Most adults should also do strengthening exercises at least twice a week. This is in addition to the moderate-intensity exercise.  Maintain a healthy weight  Body mass index (BMI) is a measurement that can be used to identify possible weight problems. It estimates body fat based on height and weight. Your health care provider can help determine your BMI and help you achieve or maintain a healthy weight.  For females 45 years of age and older: ? A BMI below 18.5 is considered underweight. ? A BMI of 18.5 to 24.9 is normal. ? A BMI of 25 to 29.9 is considered overweight. ? A BMI of 30 and above is considered obese.  Watch levels of cholesterol and blood lipids  You should start having your blood tested for lipids and cholesterol at 69 years of age, then have this test every 5 years.  You may need to have your cholesterol levels checked more often if: ? Your lipid or cholesterol levels are high. ? You are older than 69 years of age. ? You are at high risk for heart disease.  Cancer screening Lung Cancer  Lung cancer screening is recommended for adults 74-14 years old who are at high risk for lung cancer because of  a history of smoking.  A yearly low-dose CT scan of the lungs is recommended for people who: ? Currently smoke. ? Have quit within the past 15 years. ? Have at least a 30-pack-year history of smoking. A pack year is smoking an average of one pack of cigarettes a day for 1 year.  Yearly screening should continue until it has been 15 years since you quit.  Yearly screening should stop if you develop a health problem that would prevent you from having lung cancer treatment.  Breast Cancer  Practice breast self-awareness. This means  understanding how your breasts normally appear and feel.  It also means doing regular breast self-exams. Let your health care provider know about any changes, no matter how small.  If you are in your 20s or 30s, you should have a clinical breast exam (CBE) by a health care provider every 1-3 years as part of a regular health exam.  If you are 56 or older, have a CBE every year. Also consider having a breast X-ray (mammogram) every year.  If you have a family history of breast cancer, talk to your health care provider about genetic screening.  If you are at high risk for breast cancer, talk to your health care provider about having an MRI and a mammogram every year.  Breast cancer gene (BRCA) assessment is recommended for women who have family members with BRCA-related cancers. BRCA-related cancers include: ? Breast. ? Ovarian. ? Tubal. ? Peritoneal cancers.  Results of the assessment will determine the need for genetic counseling and BRCA1 and BRCA2 testing.  Cervical Cancer Your health care provider may recommend that you be screened regularly for cancer of the pelvic organs (ovaries, uterus, and vagina). This screening involves a pelvic examination, including checking for microscopic changes to the surface of your cervix (Pap test). You may be encouraged to have this screening done every 3 years, beginning at age 8.  For women ages 30-65, health care providers may recommend pelvic exams and Pap testing every 3 years, or they may recommend the Pap and pelvic exam, combined with testing for human papilloma virus (HPV), every 5 years. Some types of HPV increase your risk of cervical cancer. Testing for HPV may also be done on women of any age with unclear Pap test results.  Other health care providers may not recommend any screening for nonpregnant women who are considered low risk for pelvic cancer and who do not have symptoms. Ask your health care provider if a screening pelvic exam is  right for you.  If you have had past treatment for cervical cancer or a condition that could lead to cancer, you need Pap tests and screening for cancer for at least 20 years after your treatment. If Pap tests have been discontinued, your risk factors (such as having a new sexual partner) need to be reassessed to determine if screening should resume. Some women have medical problems that increase the chance of getting cervical cancer. In these cases, your health care provider may recommend more frequent screening and Pap tests.  Colorectal Cancer  This type of cancer can be detected and often prevented.  Routine colorectal cancer screening usually begins at 69 years of age and continues through 69 years of age.  Your health care provider may recommend screening at an earlier age if you have risk factors for colon cancer.  Your health care provider may also recommend using home test kits to check for hidden blood in the stool.  A  small camera at the end of a tube can be used to examine your colon directly (sigmoidoscopy or colonoscopy). This is done to check for the earliest forms of colorectal cancer.  Routine screening usually begins at age 37.  Direct examination of the colon should be repeated every 5-10 years through 69 years of age. However, you may need to be screened more often if early forms of precancerous polyps or small growths are found.  Skin Cancer  Check your skin from head to toe regularly.  Tell your health care provider about any new moles or changes in moles, especially if there is a change in a mole's shape or color.  Also tell your health care provider if you have a mole that is larger than the size of a pencil eraser.  Always use sunscreen. Apply sunscreen liberally and repeatedly throughout the day.  Protect yourself by wearing long sleeves, pants, a wide-brimmed hat, and sunglasses whenever you are outside.  Heart disease, diabetes, and high blood  pressure  High blood pressure causes heart disease and increases the risk of stroke. High blood pressure is more likely to develop in: ? People who have blood pressure in the high end of the normal range (130-139/85-89 mm Hg). ? People who are overweight or obese. ? People who are African American.  If you are 11-59 years of age, have your blood pressure checked every 3-5 years. If you are 92 years of age or older, have your blood pressure checked every year. You should have your blood pressure measured twice-once when you are at a hospital or clinic, and once when you are not at a hospital or clinic. Record the average of the two measurements. To check your blood pressure when you are not at a hospital or clinic, you can use: ? An automated blood pressure machine at a pharmacy. ? A home blood pressure monitor.  If you are between 58 years and 37 years old, ask your health care provider if you should take aspirin to prevent strokes.  Have regular diabetes screenings. This involves taking a blood sample to check your fasting blood sugar level. ? If you are at a normal weight and have a low risk for diabetes, have this test once every three years after 69 years of age. ? If you are overweight and have a high risk for diabetes, consider being tested at a younger age or more often. Preventing infection Hepatitis B  If you have a higher risk for hepatitis B, you should be screened for this virus. You are considered at high risk for hepatitis B if: ? You were born in a country where hepatitis B is common. Ask your health care provider which countries are considered high risk. ? Your parents were born in a high-risk country, and you have not been immunized against hepatitis B (hepatitis B vaccine). ? You have HIV or AIDS. ? You use needles to inject street drugs. ? You live with someone who has hepatitis B. ? You have had sex with someone who has hepatitis B. ? You get hemodialysis  treatment. ? You take certain medicines for conditions, including cancer, organ transplantation, and autoimmune conditions.  Hepatitis C  Blood testing is recommended for: ? Everyone born from [birth year] through 1967. ? Anyone with known risk factors for hepatitis C.  Sexually transmitted infections (STIs)  You should be screened for sexually transmitted infections (STIs) including gonorrhea and chlamydia if: ? You are sexually active and are younger than 69 years  of age. ? You are older than 69 years of age and your health care provider tells you that you are at risk for this type of infection. ? Your sexual activity has changed since you were last screened and you are at an increased risk for chlamydia or gonorrhea. Ask your health care provider if you are at risk.  If you do not have HIV, but are at risk, it may be recommended that you take a prescription medicine daily to prevent HIV infection. This is called pre-exposure prophylaxis (PrEP). You are considered at risk if: ? You are sexually active and do not regularly use condoms or know the HIV status of your partner(s). ? You take drugs by injection. ? You are sexually active with a partner who has HIV.  Talk with your health care provider about whether you are at high risk of being infected with HIV. If you choose to begin PrEP, you should first be tested for HIV. You should then be tested every 3 months for as long as you are taking PrEP. Pregnancy  If you are premenopausal and you may become pregnant, ask your health care provider about preconception counseling.  If you may become pregnant, take 400 to 800 micrograms (mcg) of folic acid every day.  If you want to prevent pregnancy, talk to your health care provider about birth control (contraception). Osteoporosis and menopause  Osteoporosis is a disease in which the bones lose minerals and strength with aging. This can result in serious bone fractures. Your risk for osteoporosis  can be identified using a bone density scan.  If you are 45 years of age or older, or if you are at risk for osteoporosis and fractures, ask your health care provider if you should be screened.  Ask your health care provider whether you should take a calcium or vitamin D supplement to lower your risk for osteoporosis.  Menopause may have certain physical symptoms and risks.  Hormone replacement therapy may reduce some of these symptoms and risks. Talk to your health care provider about whether hormone replacement therapy is right for you. Follow these instructions at home:  Schedule regular health, dental, and eye exams.  Stay current with your immunizations.  Do not use any tobacco products including cigarettes, chewing tobacco, or electronic cigarettes.  If you are pregnant, do not drink alcohol.  If you are breastfeeding, limit how much and how often you drink alcohol.  Limit alcohol intake to no more than 1 drink per day for nonpregnant women. One drink equals 12 ounces of beer, 5 ounces of wine, or 1 ounces of hard liquor.  Do not use street drugs.  Do not share needles.  Ask your health care provider for help if you need support or information about quitting drugs.  Tell your health care provider if you often feel depressed.  Tell your health care provider if you have ever been abused or do not feel safe at home. This information is not intended to replace advice given to you by your health care provider. Make sure you discuss any questions you have with your health care provider. Document Released: 02/24/2011 Document Revised: 01/17/2016 Document Reviewed: 05/15/2015 Elsevier Interactive Patient Education  [employer].

## 2017-10-19 NOTE — Progress Notes (Signed)
Subjective:    Patient ID: Karen Vaughn, female    DOB: 07/07/1951, 67 y.o.   MRN: [id number]  CC: Karen Vaughn is a 67 y.o. female who presents today for physical exam.    HPI: HTN- compliant. No CP with exercise.  Denies exertional chest pain or pressure, numbness or tingling radiating to left arm or jaw, palpitations, dizziness, frequent headaches, changes in vision, or shortness of breath.         Colorectal Cancer Screening: Would like to do cologuard. No family h/o colon cancer.  Breast Cancer Screening: Mammogram due, declines.  Cervical Cancer Screening: s/p hysterectomy. No pelvic pain, vaginal bleeding.   Bone Health screening/DEXA for 65+: due Lung Cancer Screening: Doesn't have 30 year pack year history and age > 55 years.       Tetanus - DUE 07/2018        Pneumococcal - UTD  Labs: Screening labs today. Exercise: Gets regular exercise, 3x per week. Doing of H.I.T.  Alcohol use: rarely Smoking/tobacco use: Nonsmoker.  Regular dental exams: UTD Wears seat belt: Yes. Skin: no new lesions; has a dermatologist  HISTORY:  Past Medical History:  Diagnosis Date  . Atrophic vaginitis   . Hypertension   . Menopause    After TAH, premarin 2-3 years, now occasional hot flashes  . Overweight(278.02)   . PONV (postoperative nausea and vomiting)   . Sinusitis     Past Surgical History:  Procedure Laterality Date  . KNEE ARTHROSCOPY Left 07/06/14   Dr. Magnus Ivan  torn meniscus   . TONSILLECTOMY AND ADENOIDECTOMY    . TOTAL ABDOMINAL HYSTERECTOMY W/ BILATERAL SALPINGOOPHORECTOMY  1991  . TOTAL KNEE ARTHROPLASTY Right 12/13/2015   Procedure: RIGHT TOTAL KNEE ARTHROPLASTY;  Surgeon: Kathryne Hitch, MD;  Location: [facility] OR;  Service: Orthopedics;  Laterality: Right;   Family History  Problem Relation Age of Onset  . Ovarian cancer Mother   . Emphysema Father   . Heart disease Maternal Aunt   . Stroke Maternal Aunt   . Breast cancer Paternal Grandmother         in her 60's.       ALLERGIES: Patient has no known allergies.  Current Outpatient Medications on File Prior to Visit  Medication Sig Dispense Refill  . aspirin 81 MG tablet Take 81 mg by mouth daily.    . calcium carbonate (TUMS - DOSED IN MG ELEMENTAL CALCIUM) 500 MG chewable tablet Chew 1-4 tablets by mouth at bedtime as needed for indigestion or heartburn.    . docusate sodium (COLACE) 100 MG capsule Take 100 mg by mouth daily.    . hydrochlorothiazide (HYDRODIURIL) 25 MG tablet TAKE 1 TABLET BY MOUTH ONCE A DAY 90 tablet 2  . Multiple Vitamin (MULTIVITAMIN) tablet Take 1 tablet by mouth daily.    Marland Kitchen amoxicillin-clavulanate (AUGMENTIN) 875-125 MG tablet Take 1 tablet 2 (two) times daily by mouth. (Patient not taking: Reported on 10/19/2017) 20 tablet 0   No current facility-administered medications on file prior to visit.     Social History   Tobacco Use  . Smoking status: Never Smoker  . Smokeless tobacco: Never Used  Substance Use Topics  . Alcohol use: Yes    Comment: Rarely  . Drug use: No    Review of Systems  Constitutional: Negative for chills, fever and unexpected weight change.  HENT: Negative for congestion.   Respiratory: Negative for cough.   Cardiovascular: Negative for chest pain, palpitations and leg swelling.  Gastrointestinal: Negative for nausea and vomiting.  Genitourinary: Negative for pelvic pain and vaginal bleeding.  Musculoskeletal: Negative for arthralgias and myalgias.  Skin: Negative for rash.  Neurological: Negative for headaches.  Hematological: Negative for adenopathy.  Psychiatric/Behavioral: Negative for confusion.      Objective:    BP 118/84 (BP Location: Left Arm, Patient Position: Sitting, Cuff Size: Large)   Pulse 80   Temp 98.4 F (36.9 C) (Oral)   Ht 5' 1.5" (1.562 m)   Wt 182 lb 6 oz (82.7 kg)   SpO2 98%   BMI 33.90 kg/m   BP Readings from Last 3 Encounters:  10/19/17 118/84  07/08/17 120/88  10/09/16 106/66    Wt Readings from Last 3 Encounters:  10/19/17 182 lb 6 oz (82.7 kg)  07/08/17 184 lb 3.2 oz (83.6 kg)  10/09/16 183 lb (83 kg)    Physical Exam  Constitutional: She appears well-developed and well-nourished.  Eyes: Conjunctivae are normal.  Neck: No thyroid mass and no thyromegaly present.  Cardiovascular: Normal rate, regular rhythm, normal heart sounds and normal pulses.  Pulmonary/Chest: Effort normal and breath sounds normal. She has no wheezes. She has no rhonchi. She has no rales. Right breast exhibits no inverted nipple, no mass, no nipple discharge, no skin change and no tenderness. Left breast exhibits no inverted nipple, no mass, no nipple discharge, no skin change and no tenderness. Breasts are symmetrical.  CBE performed.   Lymphadenopathy:       Head (right side): No submental, no submandibular, no tonsillar, no preauricular, no posterior auricular and no occipital adenopathy present.       Head (left side): No submental, no submandibular, no tonsillar, no preauricular, no posterior auricular and no occipital adenopathy present.    She has no cervical adenopathy.       Right cervical: No superficial cervical, no deep cervical and no posterior cervical adenopathy present.      Left cervical: No superficial cervical, no deep cervical and no posterior cervical adenopathy present.    She has no axillary adenopathy.  Neurological: She is alert.  Skin: Skin is warm and dry.  Psychiatric: She has a normal mood and affect. Her speech is normal and behavior is normal. Thought content normal.  Vitals reviewed.      Assessment & Plan:   Problem List Items Addressed This Visit      Cardiovascular and Mediastinum   HTN (hypertension)    At goal. Continue current regimen        Other   Routine physical examination - Primary    CBE performed. Declines mammogram. In absence of pelvic symptoms and h/o hysterectomy, deferred pelvic exam. Pending DEXA, cologuard. Congratulated  patient on weight loss.       Relevant Orders   CBC with Differential/Platelet   Comprehensive metabolic panel   Hemoglobin A1c   Lipid panel   TSH   VITAMIN D 25 Hydroxy (Vit-D Deficiency, Fractures)   DG Bone Density       I have discontinued Karen Vaughn. Karen Vaughn omeprazole. I am also having her maintain her aspirin, docusate sodium, calcium carbonate, multivitamin, amoxicillin-clavulanate, and hydrochlorothiazide.   No orders of the defined types were placed in this encounter.   Return precautions given.   Risks, benefits, and alternatives of the medications and treatment plan prescribed today were discussed, and patient expressed understanding.   Education regarding symptom management and diagnosis given to patient on AVS.   Continue to follow with Arnett, Lyn Records, FNP  for routine health maintenance.   Little IshikawaBarbara R Shoun and I agreed with plan.   Rennie PlowmanMargaret Arnett, FNP

## 2017-10-20 ENCOUNTER — Other Ambulatory Visit (INDEPENDENT_AMBULATORY_CARE_PROVIDER_SITE_OTHER): Payer: Medicare HMO

## 2017-10-20 DIAGNOSIS — Z0000 Encounter for general adult medical examination without abnormal findings: Secondary | ICD-10-CM

## 2017-10-20 LAB — CBC WITH DIFFERENTIAL/PLATELET
BASOS PCT: 1 % (ref 0.0–3.0)
Basophils Absolute: 0.1 10*3/uL (ref 0.0–0.1)
EOS PCT: 3.1 % (ref 0.0–5.0)
Eosinophils Absolute: 0.2 10*3/uL (ref 0.0–0.7)
HCT: 40.6 % (ref 36.0–46.0)
Hemoglobin: 13.5 g/dL (ref 12.0–15.0)
LYMPHS ABS: 2.4 10*3/uL (ref 0.7–4.0)
Lymphocytes Relative: 33.9 % (ref 12.0–46.0)
MCHC: 33.3 g/dL (ref 30.0–36.0)
MCV: 89.8 fl (ref 78.0–100.0)
MONO ABS: 0.7 10*3/uL (ref 0.1–1.0)
Monocytes Relative: 10.5 % (ref 3.0–12.0)
NEUTROS ABS: 3.6 10*3/uL (ref 1.4–7.7)
NEUTROS PCT: 51.5 % (ref 43.0–77.0)
PLATELETS: 263 10*3/uL (ref 150.0–400.0)
RBC: 4.52 Mil/uL (ref 3.87–5.11)
RDW: 12.6 % (ref 11.5–15.5)
WBC: 7 10*3/uL (ref 4.0–10.5)

## 2017-10-20 LAB — COMPREHENSIVE METABOLIC PANEL
ALT: 29 U/L (ref 0–35)
AST: 26 U/L (ref 0–37)
Albumin: 4.1 g/dL (ref 3.5–5.2)
Alkaline Phosphatase: 55 U/L (ref 39–117)
BILIRUBIN TOTAL: 0.5 mg/dL (ref 0.2–1.2)
BUN: 21 mg/dL (ref 6–23)
CO2: 31 mEq/L (ref 19–32)
Calcium: 10.2 mg/dL (ref 8.4–10.5)
Chloride: 100 mEq/L (ref 96–112)
Creatinine, Ser: 0.9 mg/dL (ref 0.40–1.20)
GFR: 66.08 mL/min (ref >60.00)
GLUCOSE: 103 mg/dL — AB (ref 70–99)
POTASSIUM: 3.8 meq/L (ref 3.5–5.1)
Sodium: 137 mEq/L (ref 135–145)
Total Protein: 7.1 g/dL (ref 6.0–8.3)

## 2017-10-20 LAB — LIPID PANEL
CHOLESTEROL: 169 mg/dL (ref 0–200)
HDL: 71.9 mg/dL (ref >39.00)
LDL CALC: 82 mg/dL (ref 0–99)
NonHDL: 97.12
Total CHOL/HDL Ratio: 2
Triglycerides: 77 mg/dL (ref 0.0–149.0)
VLDL: 15.4 mg/dL (ref 0.0–40.0)

## 2017-10-20 LAB — TSH: TSH: 3.54 u[IU]/mL (ref 0.35–4.50)

## 2017-10-20 LAB — VITAMIN D 25 HYDROXY (VIT D DEFICIENCY, FRACTURES): VITD: 28.98 ng/mL — ABNORMAL LOW (ref 30.00–100.00)

## 2017-10-20 LAB — HEMOGLOBIN A1C: Hgb A1c MFr Bld: 5.7 % (ref 4.6–6.5)

## 2017-10-25 ENCOUNTER — Encounter: Payer: Self-pay | Admitting: Family

## 2017-10-26 ENCOUNTER — Telehealth (INDEPENDENT_AMBULATORY_CARE_PROVIDER_SITE_OTHER): Payer: Self-pay

## 2017-10-26 ENCOUNTER — Ambulatory Visit (INDEPENDENT_AMBULATORY_CARE_PROVIDER_SITE_OTHER): Payer: Medicare HMO | Admitting: Physician Assistant

## 2017-10-26 ENCOUNTER — Ambulatory Visit (INDEPENDENT_AMBULATORY_CARE_PROVIDER_SITE_OTHER): Payer: Medicare HMO

## 2017-10-26 ENCOUNTER — Encounter (INDEPENDENT_AMBULATORY_CARE_PROVIDER_SITE_OTHER): Payer: Self-pay | Admitting: Physician Assistant

## 2017-10-26 DIAGNOSIS — M1712 Unilateral primary osteoarthritis, left knee: Secondary | ICD-10-CM

## 2017-10-26 NOTE — Telephone Encounter (Signed)
Submitted application online for [employer] for left knee.

## 2017-10-26 NOTE — Progress Notes (Signed)
Office Visit Note   Patient: Karen IshikawaBarbara R Benedicto           Date of Birth: 04-26-1951           MRN: [id number] Visit Date: 10/26/2017              Requested by: Allegra GranaArnett, Margaret G, FNP [address] [place], [place] [id number] PCP: Allegra GranaArnett, Margaret G, FNP   Assessment & Plan: Visit Diagnoses:  1. Unilateral primary osteoarthritis, left knee     Plan: Offered patient a cortisone injection she defers.  She would rather have a supplemental injection in the knee.  She states in the past that the supplemental injection is giving her greater relief from the cortisone.  We will call her once this is available.  Follow-Up Instructions: No Follow-up on file.   Orders:  Orders Placed This Encounter  Procedures  . XR Knee 1-2 Views Left   No orders of the defined types were placed in this encounter.     Procedures: No procedures performed   Clinical Data: No additional findings.   Subjective: Chief Complaint  Patient presents with  . Left Knee - Pain    HPI Mrs. Karen Vaughn returns today due to increasing left knee pain.  She was originally seen in 2015 and found to have a left meniscus tear.  She has been working out trying to lose weight over the past 3 weeks and is now having buckling of the left knee and increased pain in the left knee.  She is status post a right total knee arthroplasty now 22 months right knee is doing well. Review of Systems Please see HPI otherwise review of systems negative  Objective: Vital Signs: There were no vitals taken for this visit.  Physical Exam  Constitutional: She is oriented to person, place, and time. She appears well-developed and well-nourished. No distress.  Pulmonary/Chest: Effort normal.  Neurological: She is alert and oriented to person, place, and time.  Skin: She is not diaphoretic.  Psychiatric: She has a normal mood and affect.    Ortho Exam Right knee good range of motion without pain.  Surgical incisions  well-healed.  Left knee tenderness along medial joint line no effusion abnormal warmth erythema.  She has some tenderness along the lateral joint line of the left knee with palpation. Specialty Comments:  No specialty comments available.  Imaging: Xr Knee 1-2 Views Left  Result Date: 10/26/2017 Left knee AP lateral views: No acute fracture.  Lateral joint line is well-maintained.  The medial joint line is moderate to moderate severe.  Moderate patellofemoral arthritic changes.  Knee is well located.    PMFS History: Patient Active Problem List   Diagnosis Date Noted  . Unilateral primary osteoarthritis, left knee 04/29/2017  . Routine physical examination 10/09/2016  . Osteoarthritis of right knee 12/13/2015  . Status post total right knee replacement 12/13/2015  . Osteoarthritis of knee 10/09/2015  . Medicare annual wellness visit, subsequent 06/02/2014  . Acute meniscal tear of left knee 06/02/2014  . Obesity (BMI 30-39.9) 06/02/2014  . Hair loss 06/02/2014  . Screening for breast cancer 06/02/2014  . HTN (hypertension) 07/04/2013   Past Medical History:  Diagnosis Date  . Atrophic vaginitis   . Hypertension   . Menopause    After TAH, premarin 2-3 years, now occasional hot flashes  . Overweight(278.02)   . PONV (postoperative nausea and vomiting)   . Sinusitis     Family History  Problem Relation Age  of Onset  . Ovarian cancer Mother   . Emphysema Father   . Heart disease Maternal Aunt   . Stroke Maternal Aunt   . Breast cancer Paternal Grandmother        in her 6's.     Past Surgical History:  Procedure Laterality Date  . KNEE ARTHROSCOPY Left 07/06/14   Dr. Magnus Ivan  torn meniscus   . TONSILLECTOMY AND ADENOIDECTOMY    . TOTAL ABDOMINAL HYSTERECTOMY W/ BILATERAL SALPINGOOPHORECTOMY  1991  . TOTAL KNEE ARTHROPLASTY Right 12/13/2015   Procedure: RIGHT TOTAL KNEE ARTHROPLASTY;  Surgeon: Kathryne Hitch, MD;  Location: [facility] OR;  Service: Orthopedics;   Laterality: Right;   Social History   Occupational History  . Occupation: [employer]/[place]    Employer: [employer]  Tobacco Use  . Smoking status: Never Smoker  . Smokeless tobacco: Never Used  Substance and Sexual Activity  . Alcohol use: Yes    Comment: Rarely  . Drug use: No  . Sexual activity: Not on file

## 2017-11-02 ENCOUNTER — Encounter (INDEPENDENT_AMBULATORY_CARE_PROVIDER_SITE_OTHER): Payer: Self-pay | Admitting: Orthopaedic Surgery

## 2017-11-05 ENCOUNTER — Encounter: Payer: Self-pay | Admitting: Family

## 2017-11-05 DIAGNOSIS — M8588 Other specified disorders of bone density and structure, other site: Secondary | ICD-10-CM | POA: Diagnosis not present

## 2017-11-05 DIAGNOSIS — M85852 Other specified disorders of bone density and structure, left thigh: Secondary | ICD-10-CM | POA: Diagnosis not present

## 2017-11-05 DIAGNOSIS — M810 Age-related osteoporosis without current pathological fracture: Secondary | ICD-10-CM | POA: Diagnosis not present

## 2017-11-05 DIAGNOSIS — Z780 Asymptomatic menopausal state: Secondary | ICD-10-CM | POA: Diagnosis not present

## 2017-11-13 ENCOUNTER — Telehealth: Payer: Self-pay | Admitting: Family

## 2017-11-13 DIAGNOSIS — M810 Age-related osteoporosis without current pathological fracture: Secondary | ICD-10-CM

## 2017-11-13 NOTE — Telephone Encounter (Signed)
Mail to pti   Ms Karen Vaughn,    Your dexa scan shows osteoporosis at spine with t score of -2.9. Your fracture risk for the next 10 years is 1.1% for your hip and 11% for major osteoporotic fracture.    Guidelines support starting medication therapy based on the following: A hip or vertebral (clinical or morphometric) fracture  T-score ? -2.5 at the femoral neck or spine after appropriate evaluation to exclude secondary causes  Low bone mass (T-score between -1.0 and -2.5 at the femoral neck or spine) and a 10-year probability of a hip fracture ? 3% or a 10-year probability of a major osteoporosis-related fracture ? 20% based on the [place]-adapted WHO algorithm  Clinicians judgment and/or patient preferences may indicate treatment for people with 10-year fracture probabilities above or below these levels   Please make an appointment so we can discuss medication therapy and what you would prefer to do.  We need to follow this and monitor again in 2 more years to see if improved or worsened with another DEXA scan.  For post menopausal women, guidelines recommend a diet with 1200 mg of Calcium per day. If you are eating calcium rich foods, you do not need a calcium supplement. The body better absorbs the calcium that you eat over supplementation. If you do supplement, I recommend not supplementing the full 1200 mg/ day as this can lead to increased risk of cardiovascular disease. I recommend Calcium Citrate over the counter, and you may take a total of 600 to 800 mg per day in divided doses with meals for best absorption.   For bone health, you need adequate vitamin D, and I recommend you supplement as it is harder to do so with diet alone. I recommend cholecalciferol 800 units daily.  Also, please ensure you are following a diet high in calcium -- research shows better outcomes with dietary sources including kale, yogurt, broccolii, cheese, okra, almonds- to name a few.     Also remember that exercise  is a great medicine for maintain and preserve bone health. Advise moderate exercise for 30 minutes , 3 times per week.

## 2017-11-16 NOTE — Telephone Encounter (Signed)
Letter sent.

## 2017-11-16 NOTE — Telephone Encounter (Signed)
Please see below.

## 2017-11-17 ENCOUNTER — Encounter (INDEPENDENT_AMBULATORY_CARE_PROVIDER_SITE_OTHER): Payer: Self-pay | Admitting: Physician Assistant

## 2017-11-18 ENCOUNTER — Telehealth (INDEPENDENT_AMBULATORY_CARE_PROVIDER_SITE_OTHER): Payer: Self-pay

## 2017-11-18 NOTE — Telephone Encounter (Signed)
Fax PA for [facility] [phone] and [phone]

## 2017-11-19 ENCOUNTER — Telehealth (INDEPENDENT_AMBULATORY_CARE_PROVIDER_SITE_OTHER): Payer: Self-pay

## 2017-11-19 NOTE — Telephone Encounter (Signed)
Received fax from [employer] stating that office notes are needed to proceed with the authorization for SynviscOne.  Faxed office notes to [phone] and [phone].

## 2017-11-24 ENCOUNTER — Telehealth (INDEPENDENT_AMBULATORY_CARE_PROVIDER_SITE_OTHER): Payer: Self-pay

## 2017-11-24 NOTE — Telephone Encounter (Signed)
Talked with patient and advised her that authorization was approved for SynviscOne injection, left knee.  Appt.scheduled for Tuesday, 12/01/17 with Dr. Magnus IvanBlackman.

## 2017-11-30 ENCOUNTER — Ambulatory Visit (INDEPENDENT_AMBULATORY_CARE_PROVIDER_SITE_OTHER): Payer: Medicare HMO

## 2017-11-30 VITALS — BP 112/62 | HR 82 | Temp 98.8°F | Resp 12 | Ht 62.75 in | Wt 184.0 lb

## 2017-11-30 DIAGNOSIS — Z0000 Encounter for general adult medical examination without abnormal findings: Secondary | ICD-10-CM

## 2017-11-30 NOTE — Progress Notes (Addendum)
Subjective:   Karen Vaughn is a 67 y.o. female who presents for Medicare Annual (Subsequent) preventive examination.  Review of Systems:  No ROS.  Medicare Wellness Visit. Additional risk factors are reflected in the social history.  Cardiac Risk Factors include: advanced age (>40men, >34 women);hypertension;obesity (BMI >30kg/m2)     Objective:     Vitals: BP 112/62 (BP Location: Right Arm, Patient Position: Sitting, Cuff Size: Normal)   Pulse 82   Temp 98.8 F (37.1 C) (Oral)   Resp 12   Ht 5' 2.75" (1.594 m)   Wt 184 lb (83.5 kg)   SpO2 97%   BMI 32.85 kg/m   Body mass index is 32.85 kg/m.  Advanced Directives 11/30/2017 12/13/2015  Does Patient Have a Medical Advance Directive? Yes No  Type of Advance Directive Living will -  Does patient want to make changes to medical advance directive? No - Patient declined -  Would patient like information on creating a medical advance directive? - No - patient declined information    Tobacco Social History   Tobacco Use  Smoking Status Never Smoker  Smokeless Tobacco Never Used     Counseling given: Not Answered   Clinical Intake:  Pre-visit preparation completed: Yes  Pain : No/denies pain     Nutritional Status: BMI > 30  Obese Diabetes: No  How often do you need to have someone help you when you read instructions, pamphlets, or other written materials from your doctor or pharmacy?: 1 - Never  Interpreter Needed?: No     Past Medical History:  Diagnosis Date  . Atrophic vaginitis   . Hypertension   . Menopause    After TAH, premarin 2-3 years, now occasional hot flashes  . Overweight(278.02)   . PONV (postoperative nausea and vomiting)   . Sinusitis    Past Surgical History:  Procedure Laterality Date  . KNEE ARTHROSCOPY Left 07/06/14   Dr. Magnus Ivan  torn meniscus   . TONSILLECTOMY AND ADENOIDECTOMY    . TOTAL ABDOMINAL HYSTERECTOMY W/ BILATERAL SALPINGOOPHORECTOMY  1991  . TOTAL KNEE  ARTHROPLASTY Right 12/13/2015   Procedure: RIGHT TOTAL KNEE ARTHROPLASTY;  Surgeon: Kathryne Hitch, MD;  [facility];  Service: Orthopedics;  Laterality: Right;   Family History  Problem Relation Age of Onset  . Ovarian cancer Mother   . Emphysema Father   . Heart disease Maternal Aunt   . Stroke Maternal Aunt   . Breast cancer Paternal Grandmother        in her 43's.    Social History   Socioeconomic History  . Marital status: Married    Spouse name: Not on file  . Number of children: 0  . Years of education: Not on file  . Highest education level: Not on file  Occupational History  . Occupation: [employer]/[place]    Employer: [employer]  Social Needs  . Financial resource strain: Not hard at all  . Food insecurity:    Worry: Never true    Inability: Never true  . Transportation needs:    Medical: No    Non-medical: No  Tobacco Use  . Smoking status: Never Smoker  . Smokeless tobacco: Never Used  Substance and Sexual Activity  . Alcohol use: Yes    Comment: Rarely  . Drug use: No  . Sexual activity: Not on file  Lifestyle  . Physical activity:    Days per week: Not on file    Minutes per  session: Not on file  . Stress: Not on file  Relationships  . Social connections:    Talks on phone: Not on file    Gets together: Not on file    Attends religious service: Not on file    Active member of club or organization: Not on file    Attends meetings of clubs or organizations: Not on file    Relationship status: Not on file  Other Topics Concern  . Not on file  Social History Narrative   Married.       Karen Vaughn at gym calls her 'Karen Vaughn'      Retired [employer] - [employer].     Outpatient Encounter Medications as of 11/30/2017  Medication Sig  . aspirin 81 MG tablet Take 81 mg by mouth daily.  . calcium carbonate (TUMS - DOSED IN MG ELEMENTAL CALCIUM) 500 MG chewable tablet Chew 1-4 tablets by mouth at bedtime as needed  for indigestion or heartburn.  . docusate sodium (COLACE) 100 MG capsule Take 100 mg by mouth daily.  . hydrochlorothiazide (HYDRODIURIL) 25 MG tablet TAKE 1 TABLET BY MOUTH ONCE A DAY  . Multiple Vitamin (MULTIVITAMIN) tablet Take 1 tablet by mouth daily.  Marland Kitchen. OLIVE LEAF EXTRACT PO Take 1 capsule by mouth.  . [DISCONTINUED] amoxicillin-clavulanate (AUGMENTIN) 875-125 MG tablet Take 1 tablet 2 (two) times daily by mouth.   No facility-administered encounter medications on file as of 11/30/2017.     Activities of Daily Living In your present state of health, do you have any difficulty performing the following activities: 11/30/2017  Hearing? N  Vision? N  Difficulty concentrating or making decisions? N  Walking or climbing stairs? N  Dressing or bathing? N  Doing errands, shopping? N  Preparing Food and eating ? N  Using the Toilet? N  In the past six months, have you accidently leaked urine? N  Do you have problems with loss of bowel control? N  Managing your Medications? N  Managing your Finances? N  Housekeeping or managing your Housekeeping? N  Some recent data might be hidden    Patient Care Team: Allegra GranaArnett, Margaret G, FNP as PCP - General (Family Medicine)    Assessment:   This is a routine wellness examination for Karen Vaughn. The goal of the wellness visit is to assist the patient how to close the gaps in care and create a preventative care plan for the patient.   The roster of all physicians providing medical care to patient is listed in the Snapshot section of the chart.  Taking calcium as appropriate/Osteoporosis reviewed.    Safety issues reviewed; Smoke and carbon monoxide detectors in the home. No firearms or firearms locked in a safe within the home. Wears seatbelts when driving or riding with others. No violence in the home.  They do not have excessive sun exposure.  Discussed the need for sun protection: hats, long sleeves and the use of sunscreen if there is  significant sun exposure.  Patient is alert, normal appearance, oriented to person/place/and time.  Correctly identified the president of the [place] and recalls of 3/3 words. Performs simple calculations and can read correct time from watch face. Displays appropriate judgement.  No new identified risk were noted.  No failures at ADL's or IADL's.    BMI- discussed the importance of a healthy diet, water intake and the benefits of aerobic exercise. Educational material provided.   24 hour diet recall: Low cholesterol diet/heart healthy diet Shak[employer]'s performance products  Dental-  UTD  Eye- Visual acuity not assessed per patient preference since they have regular follow up with the ophthalmologist.  Wears corrective lenses.  Sleep patterns- Sleeps 8 hours at night.  Wakes feeling rested.   Mammogram- declined; removed from future health maintenance per patient preference.  Patient Concerns: None at this time. Follow up with PCP as needed.  Exercise Activities and Dietary recommendations Current Exercise Habits: Structured exercise class, Type of exercise: stretching;strength training/weights;calisthenics, Time (Minutes): 30, Frequency (Times/Week): 3, Weekly Exercise (Minutes/Week): 90, Intensity: Moderate  Goals    . Weight (lb) < 180 lb (81.6 kg)     Lose 20lbs        Fall Risk Fall Risk  11/30/2017 10/09/2016 06/02/2014  Falls in the past year? No No No   Depression Screen PHQ 2/9 Scores 11/30/2017 10/09/2016 06/02/2014  PHQ - 2 Score 0 0 0     Cognitive Function MMSE - Mini Mental State Exam 11/30/2017  Orientation to time 5  Orientation to Place 5  Registration 3  Attention/ Calculation 5  Recall 3  Language- name 2 objects 2  Language- repeat 1  Language- follow 3 step command 3  Language- read & follow direction 1  Write a sentence 1  Copy design 1  Total score 30        Immunization History  Administered Date(s) Administered  . Hep A / Hep B 03/08/2012,  05/09/2012, 09/08/2012  . Influenza Split 04/26/2011  . Influenza,inj,Quad PF,6+ Mos 06/02/2014  . Influenza-Unspecified 05/23/2013, 05/23/2015, 06/25/2016  . Pneumococcal Conjugate-13 06/02/2014  . Pneumococcal Polysaccharide-23 07/06/2009, 10/14/2016  . Tdap 08/03/2008  . Zoster 07/25/2014   Screening Tests Health Maintenance  Topic Date Due  . DEXA SCAN  04/14/2014  . INFLUENZA VACCINE  03/25/2018  . TETANUS/TDAP  08/03/2018  . COLONOSCOPY  06/02/2020  . Hepatitis C Screening  Completed  . PNA vac Low Risk Adult  Completed  . MAMMOGRAM  Discontinued      Plan:    End of life planning; Advance aging; Advanced directives discussed. Copy of current HCPOA/Living Will requested.    I have personally reviewed and noted the following in the patient's chart:   . Medical and social history . Use of alcohol, tobacco or illicit drugs  . Current medications and supplements . Functional ability and status . Nutritional status . Physical activity . Advanced directives . List of other physicians . Hospitalizations, surgeries, and ER visits in previous 12 months . Vitals . Screenings to include cognitive, depression, and falls . Referrals and appointments  In addition, I have reviewed and discussed with patient certain preventive protocols, quality metrics, and best practice recommendations. A written personalized care plan for preventive services as well as general preventive health recommendations were provided to patient.     Ashok Pall, LPN  11/28/2701  Agree with plan. Rennie Plowman, NP

## 2017-11-30 NOTE — Patient Instructions (Addendum)
  Ms. Karen Vaughn , Thank you for taking time to come for your Medicare Wellness Visit. I appreciate your ongoing commitment to your health goals. Please review the following plan we discussed and let me know if I can assist you in the future.   Follow up as needed.    Bring a copy of your Health Care Power of Attorney and/or Living Will to be scanned into chart.  Have a great day!  These are the goals we discussed: Goals    . Weight (lb) < 180 lb (81.6 kg)     Lose 20lbs        This is a list of the screening recommended for you and due dates:  Health Maintenance  Topic Date Due  . DEXA scan (bone density measurement)  04/14/2014  . Flu Shot  03/25/2018  . Tetanus Vaccine  08/03/2018  . Colon Cancer Screening  06/02/2020  .  Hepatitis C: One time screening is recommended by Center for Disease Control  (CDC) for  adults born from [birth year] through 1965.   Completed  . Pneumonia vaccines  Completed  . Mammogram  Discontinued

## 2017-12-01 ENCOUNTER — Telehealth: Payer: Self-pay

## 2017-12-01 ENCOUNTER — Ambulatory Visit (INDEPENDENT_AMBULATORY_CARE_PROVIDER_SITE_OTHER): Payer: Medicare HMO | Admitting: Physician Assistant

## 2017-12-01 ENCOUNTER — Other Ambulatory Visit: Payer: Self-pay | Admitting: Family

## 2017-12-01 ENCOUNTER — Encounter (INDEPENDENT_AMBULATORY_CARE_PROVIDER_SITE_OTHER): Payer: Self-pay | Admitting: Orthopaedic Surgery

## 2017-12-01 DIAGNOSIS — Z1211 Encounter for screening for malignant neoplasm of colon: Secondary | ICD-10-CM

## 2017-12-01 DIAGNOSIS — M1712 Unilateral primary osteoarthritis, left knee: Secondary | ICD-10-CM

## 2017-12-01 MED ORDER — HYLAN G-F 20 48 MG/6ML IX SOSY
48.0000 mg | PREFILLED_SYRINGE | INTRA_ARTICULAR | Status: AC | PRN
  Administered 2017-12-01: 48 mg via INTRA_ARTICULAR

## 2017-12-01 MED ORDER — LIDOCAINE HCL 1 % IJ SOLN
3.0000 mL | INTRAMUSCULAR | Status: AC | PRN
  Administered 2017-12-01: 3 mL

## 2017-12-01 NOTE — Progress Notes (Signed)
   Procedure Note  Patient: Karen Vaughn             Date of Birth: Sep 23, 1950           MRN: [id number]             Visit Date: 12/01/2017  HPI: Mrs. Karen Vaughn is a comes in today for Synvisc 1 injection left knee.  She has known osteoarthritis of the left knee.  Said no new injury to the knee. Physical exam: Left knee No effusion abnormal warmth erythema.  Tenderness along medial joint line left knee.  Overall good range of motion left knee. Procedures: Visit Diagnoses: Unilateral primary osteoarthritis, left knee  Large Joint Inj: L knee on 12/01/2017 10:23 AM Indications: pain Details: 22 G 1.5 in needle, anterolateral approach  Arthrogram: No  Medications: 3 mL lidocaine 1 %; 48 mg Hylan 48 MG/6ML Outcome: tolerated well, no immediate complications Procedure, treatment alternatives, risks and benefits explained, specific risks discussed. Consent was given by the patient. Immediately prior to procedure a time out was called to verify the correct patient, procedure, equipment, support staff and site/side marked as required. Patient was prepped and draped in the usual sterile fashion.     Plan: We will see her back in 8 weeks to check her progress with the Synvisc 1 injection.  She understands that she can have Synvisc 1 injections no more often than every 6 months.  Cortisone injections every 3 months.

## 2017-12-01 NOTE — Telephone Encounter (Signed)
Cologuard requisition faxed

## 2017-12-05 DIAGNOSIS — Z1211 Encounter for screening for malignant neoplasm of colon: Secondary | ICD-10-CM | POA: Diagnosis not present

## 2017-12-05 DIAGNOSIS — Z1212 Encounter for screening for malignant neoplasm of rectum: Secondary | ICD-10-CM | POA: Diagnosis not present

## 2017-12-05 LAB — COLOGUARD: COLOGUARD: NEGATIVE

## 2017-12-08 ENCOUNTER — Ambulatory Visit: Payer: Medicare HMO

## 2017-12-15 ENCOUNTER — Telehealth: Payer: Self-pay | Admitting: Family

## 2017-12-15 NOTE — Telephone Encounter (Signed)
Copied from CRM [phone]. Topic: Quick Communication - See Telephone Encounter >> Dec 15, 2017  2:14 PM Eston Mouldavis, Cyndra Feinberg B wrote: CRM for notification. See Telephone encounter for: 12/15/17. Pt returned call to CorbinKristin. She states the best number to call her back on is [phone]

## 2017-12-15 NOTE — Telephone Encounter (Signed)
Please advise 

## 2017-12-16 ENCOUNTER — Telehealth: Payer: Self-pay | Admitting: Family

## 2017-12-16 NOTE — Telephone Encounter (Signed)
See duplicate message from 12/15/17 spoke with patient and advised of results of Cologuard

## 2017-12-16 NOTE — Telephone Encounter (Signed)
Patient advised of below and verbalized understanding.  

## 2017-12-16 NOTE — Telephone Encounter (Signed)
Call pt     let  know Cologuard NEGATIVE which indicates lower likelihood of cancer or pre cancer. You will need to repeat test in 3 years or sooner if any symptoms including blood in stool, change in bowel habits.   

## 2018-01-27 ENCOUNTER — Ambulatory Visit (INDEPENDENT_AMBULATORY_CARE_PROVIDER_SITE_OTHER): Payer: Medicare HMO | Admitting: Family

## 2018-01-27 ENCOUNTER — Ambulatory Visit
Admission: RE | Admit: 2018-01-27 | Discharge: 2018-01-27 | Disposition: A | Discharge status: Home / Self Care | Payer: Medicare HMO | Source: Ambulatory Visit | Attending: Family | Admitting: Family

## 2018-01-27 ENCOUNTER — Encounter: Payer: Self-pay | Admitting: Family

## 2018-01-27 VITALS — BP 118/74 | HR 70 | Temp 98.0°F | Resp 16 | Wt 181.2 lb

## 2018-01-27 DIAGNOSIS — R1032 Left lower quadrant pain: Secondary | ICD-10-CM | POA: Diagnosis not present

## 2018-01-27 DIAGNOSIS — R197 Diarrhea, unspecified: Secondary | ICD-10-CM

## 2018-01-27 DIAGNOSIS — R911 Solitary pulmonary nodule: Secondary | ICD-10-CM | POA: Insufficient documentation

## 2018-01-27 MED ORDER — METRONIDAZOLE 500 MG PO TABS
500.0000 mg | ORAL_TABLET | Freq: Three times a day (TID) | ORAL | 0 refills | Status: DC
Start: 2018-01-27 — End: 2018-10-22

## 2018-01-27 MED ORDER — IOHEXOL 300 MG/ML  SOLN
100.0000 mL | Freq: Once | INTRAMUSCULAR | Status: AC | PRN
  Administered 2018-01-27: 100 mL via INTRAVENOUS

## 2018-01-27 MED ORDER — CIPROFLOXACIN HCL 500 MG PO TABS: 500.0000 mg | ORAL_TABLET | Freq: Two times a day (BID) | ORAL | 0 refills | Status: DC

## 2018-01-27 NOTE — Patient Instructions (Addendum)
Labs today  Start antibiotics as discussed if CT shows diverticulitis.  Look for text from [phone] - will advise to start antibiotics if shows diverticulitis.   Stool cultures tomorrow morning at [facility].   Bring stool cards back to OUR office.   Please let me know of any new or worsening symptoms.   Plenty of water.   Stay on probiotics.  Food Choices to Help Relieve Diarrhea, Adult When you have diarrhea, the foods you eat and your eating habits are very important. Choosing the right foods and drinks can help:  Relieve diarrhea.  Replace lost fluids and nutrients.  Prevent dehydration.  What general guidelines should I follow? Relieving diarrhea  Choose foods with less than 2 g or .07 oz. of fiber per serving.  Limit fats to less than 8 tsp (38 g or 1.34 oz.) a day.  Avoid the following: ? Foods and beverages sweetened with high-fructose corn syrup, honey, or sugar alcohols such as xylitol, sorbitol, and mannitol. ? Foods that contain a lot of fat or sugar. ? Fried, greasy, or spicy foods. ? High-fiber grains, breads, and cereals. ? Raw fruits and vegetables.  Eat foods that are rich in probiotics. These foods include dairy products such as yogurt and fermented milk products. They help increase healthy bacteria in the stomach and intestines (gastrointestinal tract, or GI tract).  If you have lactose intolerance, avoid dairy products. These may make your diarrhea worse.  Take medicine to help stop diarrhea (antidiarrheal medicine) only as told by your health care provider. Replacing nutrients  Eat small meals or snacks every 3-4 hours.  Eat bland foods, such as white rice, toast, or baked potato, until your diarrhea starts to get better. Gradually reintroduce nutrient-rich foods as tolerated or as told by your health care provider. This includes: ? Well-cooked protein foods. ? Peeled, seeded, and soft-cooked fruits and vegetables. ? Low-fat dairy products.  Take  vitamin and mineral supplements as told by your health care provider. Preventing dehydration   Start by sipping water or a special solution to prevent dehydration (oral rehydration solution, ORS). Urine that is clear or pale yellow means that you are getting enough fluid.  Try to drink at least 8-10 cups of fluid each day to help replace lost fluids.  You may add other liquids in addition to water, such as clear juice or decaffeinated sports drinks, as tolerated or as told by your health care provider.  Avoid drinks with caffeine, such as coffee, tea, or soft drinks.  Avoid alcohol. What foods are recommended? The items listed may not be a complete list. Talk with your health care provider about what dietary choices are best for you. Grains White rice. White, [place], or pita breads (fresh or toasted), including plain rolls, buns, or bagels. White pasta. Saltine, soda, or graham crackers. Pretzels. Low-fiber cereal. Cooked cereals made with water (such as cornmeal, farina, or cream cereals). Plain muffins. Matzo. Melba toast. Zwieback. Vegetables Potatoes (without the skin). Most well-cooked and canned vegetables without skins or seeds. Tender lettuce. Fruits Apple sauce. Fruits canned in juice. Cooked apricots, cherries, grapefruit, peaches, pears, or plums. Fresh bananas and cantaloupe. Meats and other protein foods Baked or boiled chicken. Eggs. Tofu. Fish. Seafood. Smooth nut butters. Ground or well-cooked tender beef, ham, veal, lamb, pork, or poultry. Dairy Plain yogurt, kefir, and unsweetened liquid yogurt. Lactose-free milk, buttermilk, skim milk, or soy milk. Low-fat or nonfat hard cheese. Beverages Water. Low-calorie sports drinks. Fruit juices without pulp. Strained  tomato and vegetable juices. Decaffeinated teas. Sugar-free beverages not sweetened with sugar alcohols. Oral rehydration solutions, if approved by your health care provider. Seasoning and other foods Bouillon,  broth, or soups made from recommended foods. What foods are not recommended? The items listed may not be a complete list. Talk with your health care provider about what dietary choices are best for you. Grains Whole grain, whole wheat, bran, or rye breads, rolls, pastas, and crackers. Wild or brown rice. Whole grain or bran cereals. Barley. Oats and oatmeal. Corn tortillas or taco shells. Granola. Popcorn. Vegetables Raw vegetables. Fried vegetables. Cabbage, broccoli, Brussels sprouts, artichokes, baked beans, beet greens, corn, kale, legumes, peas, sweet potatoes, and yams. Potato skins. Cooked spinach and cabbage. Fruits Dried fruit, including raisins and dates. Raw fruits. Stewed or dried prunes. Canned fruits with syrup. Meat and other protein foods Fried or fatty meats. Deli meats. Chunky nut butters. Nuts and seeds. Beans and lentils. Tomasa BlaseBacon. Hot dogs. Sausage. Dairy High-fat cheeses. Whole milk, chocolate milk, and beverages made with milk, such as milk shakes. Half-and-half. Cream. sour cream. Ice cream. Beverages Caffeinated beverages (such as coffee, tea, soda, or energy drinks). Alcoholic beverages. Fruit juices with pulp. Prune juice. Soft drinks sweetened with high-fructose corn syrup or sugar alcohols. High-calorie sports drinks. Fats and oils Butter. Cream sauces. Margarine. Salad oils. Plain salad dressings. Olives. Avocados. Mayonnaise. Sweets and desserts Sweet rolls, doughnuts, and sweet breads. Sugar-free desserts sweetened with sugar alcohols such as xylitol and sorbitol. Seasoning and other foods Honey. Hot sauce. Chili powder. Gravy. Cream-based or milk-based soups. Pancakes and waffles. Summary  When you have diarrhea, the foods you eat and your eating habits are very important.  Make sure you get at least 8-10 cups of fluid each day, or enough to keep your urine clear or pale yellow.  Eat bland foods and gradually reintroduce healthy, nutrient-rich foods as  tolerated, or as told by your health care provider.  Avoid high-fiber, fried, greasy, or spicy foods. This information is not intended to replace advice given to you by your health care provider. Make sure you discuss any questions you have with your health care provider. Document Released: 11/01/2003 Document Revised: 08/08/2016 Document Reviewed: 08/08/2016 Elsevier Interactive Patient Education  2018 [employer] Labs here

## 2018-01-27 NOTE — Progress Notes (Signed)
Subjective:    Patient ID: Karen Vaughn, female    DOB: 10-06-1950, 67 y.o.   MRN: [id number]  CC: Karen Vaughn is a 67 y.o. female who presents today for an acute visit.    HPI:  Chief complaint of diarrhea which occurred one morning while eating [place] [address] restaurant after grilled cheese, 7 -8 weeks ago. Husband didn't get ill.   Usually occurs in the a.m. about 3-7 times prior to eating. Foul odor. Today stool is more like puddles.  Describes as urgency.  Describes as liquid brownish to greenish in color.  She denies any blood, bloating, cramping, fever, n, v.  Tried elimination diet  With no cafffine, brat diet., no relief. Some relief with Imodium. On probiotics, no real change.   Does describe a lot of sinus drainage, 1 month, unchanged. Endorses post nasal drip. Sudafed for temporary relief.   No recent antibiotics.     States okay to text results tonight    HISTORY:  Past Medical History:  Diagnosis Date  . Atrophic vaginitis   . Hypertension   . Menopause    After TAH, premarin 2-3 years, now occasional hot flashes  . Overweight(278.02)   . PONV (postoperative nausea and vomiting)   . Sinusitis    Past Surgical History:  Procedure Laterality Date  . KNEE ARTHROSCOPY Left 07/06/14   Dr. Magnus IvanBlackman  torn meniscus   . TONSILLECTOMY AND ADENOIDECTOMY    . TOTAL ABDOMINAL HYSTERECTOMY W/ BILATERAL SALPINGOOPHORECTOMY  1991  . TOTAL KNEE ARTHROPLASTY Right 12/13/2015   Procedure: RIGHT TOTAL KNEE ARTHROPLASTY;  Surgeon: Kathryne Hitchhristopher Y Blackman, MD;  [facility];  Service: Orthopedics;  Laterality: Right;   Family History  Problem Relation Age of Onset  . Ovarian cancer Mother   . Emphysema Father   . Heart disease Maternal Aunt   . Stroke Maternal Aunt   . Breast cancer Paternal Grandmother        in her 5420's.     Allergies: Patient has no known allergies. Current Outpatient Medications on File Prior to Visit  Medication Sig Dispense Refill  . aspirin  81 MG tablet Take 81 mg by mouth daily.    Marland Kitchen. docusate sodium (COLACE) 100 MG capsule Take 100 mg by mouth daily.    . hydrochlorothiazide (HYDRODIURIL) 25 MG tablet TAKE 1 TABLET BY MOUTH ONCE A DAY 90 tablet 2  . Multiple Vitamin (MULTIVITAMIN) tablet Take 1 tablet by mouth daily.    Marland Kitchen. OLIVE LEAF EXTRACT PO Take 1 capsule by mouth.    . calcium carbonate (TUMS - DOSED IN MG ELEMENTAL CALCIUM) 500 MG chewable tablet Chew 1-4 tablets by mouth at bedtime as needed for indigestion or heartburn.     No current facility-administered medications on file prior to visit.     Social History   Tobacco Use  . Smoking status: Never Smoker  . Smokeless tobacco: Never Used  Substance Use Topics  . Alcohol use: Yes    Comment: Rarely  . Drug use: No    Review of Systems  Constitutional: Negative for chills and fever.  Respiratory: Negative for cough.   Cardiovascular: Negative for chest pain and palpitations.  Gastrointestinal: Positive for diarrhea. Negative for abdominal pain, blood in stool, constipation, nausea and vomiting.  Genitourinary: Negative for dysuria and urgency.      Objective:    BP 118/74 (BP Location: Left Arm, Patient Position: Sitting, Cuff Size: Normal)   Pulse 70   Temp 98  F (36.7 C) (Oral)   Resp 16   Wt 181 lb 4 oz (82.2 kg)   SpO2 98%   BMI 32.36 kg/m   Wt Readings from Last 3 Encounters:  01/27/18 181 lb 4 oz (82.2 kg)  11/30/17 184 lb (83.5 kg)  10/19/17 182 lb 6 oz (82.7 kg)    Physical Exam  Constitutional: She appears well-developed and well-nourished.  Eyes: Conjunctivae are normal.  Cardiovascular: Normal rate, regular rhythm, normal heart sounds and normal pulses.  Pulmonary/Chest: Effort normal and breath sounds normal. She has no wheezes. She has no rhonchi. She has no rales.  Abdominal: Soft. Normal appearance and bowel sounds are normal. She exhibits no distension, no fluid wave, no ascites and no mass. There is tenderness in the left lower  quadrant. There is no rigidity, no rebound, no guarding and no CVA tenderness.  Rebound with deep palpation of LLQ  Neurological: She is alert.  Skin: Skin is warm and dry.  Psychiatric: She has a normal mood and affect. Her speech is normal and behavior is normal. Thought content normal.  Vitals reviewed.      Assessment & Plan:   1. Diarrhea, unspecified type/2. Left lower quadrant pain Patient is well-appearing.  She is nontoxic in appearance and is afebrile.  Surprisingly, she did not describe this during HPI, patient had focal tenderness left lower quadrant on exam.  Exhibited some rebound.  No h/o diverticulitis. CT abdominal pelvis did not show any acute abnormalities.  Incidental findings of left lung nodule which were discussed with patient (see mychart messages, telephone encounter) .    At this time, GI pathogen panel, C. difficile negative.  We will discuss these results with patient, and advise a GI appointment, for chronic  Diarrhea.  - Fecal occult blood, imunochemical; Future - C Difficile Quick Screen w PCR reflex; Future - Gastrointestinal Pathogen Panel PCR; Future - Comprehensive metabolic panel - CBC with Differential/Platelet  - ciprofloxacin (CIPRO) 500 MG tablet; Take 1 tablet (500 mg total) by mouth 2 (two) times daily.  Dispense: 14 tablet; Refill: 0 - metroNIDAZOLE (FLAGYL) 500 MG tablet; Take 1 tablet (500 mg total) by mouth 3 (three) times daily.  Dispense: 21 tablet; Refill: 0 - CT ABDOMEN PELVIS W CONTRAST - CBC with Differential/Platelet    I am having Karen Vaughn. Citro maintain her aspirin, docusate sodium, calcium carbonate, multivitamin, hydrochlorothiazide, and OLIVE LEAF EXTRACT PO.   No orders of the defined types were placed in this encounter.   Return precautions given.   Risks, benefits, and alternatives of the medications and treatment plan prescribed today were discussed, and patient expressed understanding.   Education regarding  symptom management and diagnosis given to patient on AVS.  Continue to follow with Karen Grana, FNP for routine health maintenance.   Karen Vaughn and I agreed with plan.   Rennie Plowman, FNP

## 2018-01-28 ENCOUNTER — Encounter: Payer: Self-pay | Admitting: Family

## 2018-01-28 ENCOUNTER — Other Ambulatory Visit
Admission: RE | Admit: 2018-01-28 | Discharge: 2018-01-28 | Disposition: A | Discharge status: Home / Self Care | Payer: Medicare HMO | Source: Ambulatory Visit | Attending: Family | Admitting: Family

## 2018-01-28 DIAGNOSIS — R197 Diarrhea, unspecified: Secondary | ICD-10-CM | POA: Diagnosis present

## 2018-01-28 LAB — CBC WITH DIFFERENTIAL/PLATELET
BASOS PCT: 0.9 % (ref 0.0–3.0)
Basophils Absolute: 0.1 10*3/uL (ref 0.0–0.1)
EOS ABS: 0.1 10*3/uL (ref 0.0–0.7)
Eosinophils Relative: 1.9 % (ref 0.0–5.0)
HEMATOCRIT: 39.4 % (ref 36.0–46.0)
HEMOGLOBIN: 13.1 g/dL (ref 12.0–15.0)
LYMPHS PCT: 25.8 % (ref 12.0–46.0)
Lymphs Abs: 1.7 10*3/uL (ref 0.7–4.0)
MCHC: 33.4 g/dL (ref 30.0–36.0)
MCV: 86.1 fl (ref 78.0–100.0)
MONOS PCT: 10.3 % (ref 3.0–12.0)
Monocytes Absolute: 0.7 10*3/uL (ref 0.1–1.0)
Neutro Abs: 4.1 10*3/uL (ref 1.4–7.7)
Neutrophils Relative %: 61.1 % (ref 43.0–77.0)
Platelets: 239 10*3/uL (ref 150.0–400.0)
RBC: 4.57 Mil/uL (ref 3.87–5.11)
RDW: 14.5 % (ref 11.5–15.5)
WBC: 6.7 10*3/uL (ref 4.0–10.5)

## 2018-01-28 LAB — COMPREHENSIVE METABOLIC PANEL
ALBUMIN: 3.9 g/dL (ref 3.5–5.2)
ALT: 28 U/L (ref 0–35)
AST: 28 U/L (ref 0–37)
Alkaline Phosphatase: 53 U/L (ref 39–117)
BUN: 14 mg/dL (ref 6–23)
CALCIUM: 9.4 mg/dL (ref 8.4–10.5)
CO2: 31 mEq/L (ref 19–32)
CREATININE: 0.89 mg/dL (ref 0.40–1.20)
Chloride: 101 mEq/L (ref 96–112)
GFR: 66.88 mL/min (ref >60.00)
Glucose, Bld: 106 mg/dL — ABNORMAL HIGH (ref 70–99)
POTASSIUM: 3.5 meq/L (ref 3.5–5.1)
Sodium: 139 mEq/L (ref 135–145)
Total Bilirubin: 0.3 mg/dL (ref 0.2–1.2)
Total Protein: 6.6 g/dL (ref 6.0–8.3)

## 2018-01-28 LAB — GASTROINTESTINAL PANEL BY PCR, STOOL (REPLACES STOOL CULTURE)

## 2018-01-28 LAB — C DIFFICILE QUICK SCREEN W PCR REFLEX
C DIFFICLE (CDIFF) ANTIGEN: NEGATIVE
C Diff interpretation: NOT DETECTED
C Diff toxin: NEGATIVE

## 2018-01-28 LAB — OCCULT BLOOD X 1 CARD TO LAB, STOOL: Fecal Occult Bld: NEGATIVE

## 2018-01-28 LAB — POCT I-STAT CREATININE: CREATININE: 0.9 mg/dL (ref 0.44–1.00)

## 2018-01-29 ENCOUNTER — Encounter: Payer: Self-pay | Admitting: Family

## 2018-01-29 ENCOUNTER — Telehealth: Payer: Self-pay | Admitting: Family

## 2018-01-29 ENCOUNTER — Other Ambulatory Visit: Payer: Self-pay | Admitting: Family

## 2018-01-29 DIAGNOSIS — I700 Atherosclerosis of aorta: Secondary | ICD-10-CM

## 2018-01-29 DIAGNOSIS — R911 Solitary pulmonary nodule: Secondary | ICD-10-CM

## 2018-01-29 DIAGNOSIS — R197 Diarrhea, unspecified: Secondary | ICD-10-CM

## 2018-01-29 NOTE — Telephone Encounter (Signed)
Called and discussed with patient that there was no acute findings on her CT abdomen pelvis.  Discussed incidental findings of the left lung nodule.  She declines any smoking history.  She does report being around secondhand smoke.

## 2018-01-29 NOTE — Progress Notes (Signed)
close

## 2018-01-31 ENCOUNTER — Encounter: Payer: Self-pay | Admitting: Family

## 2018-02-01 ENCOUNTER — Encounter (INDEPENDENT_AMBULATORY_CARE_PROVIDER_SITE_OTHER): Payer: Self-pay

## 2018-02-02 ENCOUNTER — Ambulatory Visit (INDEPENDENT_AMBULATORY_CARE_PROVIDER_SITE_OTHER): Payer: Medicare HMO | Admitting: Physician Assistant

## 2018-02-02 ENCOUNTER — Encounter: Payer: Self-pay | Admitting: Family

## 2018-02-05 ENCOUNTER — Ambulatory Visit: Payer: Medicare HMO

## 2018-02-15 ENCOUNTER — Ambulatory Visit (INDEPENDENT_AMBULATORY_CARE_PROVIDER_SITE_OTHER): Payer: Medicare HMO | Admitting: Orthopaedic Surgery

## 2018-02-15 ENCOUNTER — Encounter (INDEPENDENT_AMBULATORY_CARE_PROVIDER_SITE_OTHER): Payer: Self-pay | Admitting: Orthopaedic Surgery

## 2018-02-15 DIAGNOSIS — M1712 Unilateral primary osteoarthritis, left knee: Secondary | ICD-10-CM

## 2018-02-15 MED ORDER — METHYLPREDNISOLONE ACETATE 40 MG/ML IJ SUSP
40.0000 mg | INTRAMUSCULAR | Status: AC | PRN
  Administered 2018-02-15: 40 mg via INTRA_ARTICULAR

## 2018-02-15 MED ORDER — LIDOCAINE HCL 1 % IJ SOLN
3.0000 mL | INTRAMUSCULAR | Status: AC | PRN
  Administered 2018-02-15: 3 mL

## 2018-02-15 NOTE — Progress Notes (Signed)
Office Visit Note   Patient: Karen Vaughn           Date of Birth: November 01, 1950           MRN: [id number] Visit Date: 02/15/2018              Requested by: Allegra GranaArnett, Margaret G, FNP [address] [place], [place] [id number] PCP: Allegra GranaArnett, Margaret G, FNP   Assessment & Plan: Visit Diagnoses:  1. Unilateral primary osteoarthritis, left knee     Plan: I agree with her plans of trying a steroid injection in her left knee today.  She understands the risk minutes injections and she tolerated well.  She would like to have one in early October of this year [place].  I agree with this as well.  We will set that appointment up for her.  Follow-Up Instructions: Return in about 3 months (around 05/18/2018).   Orders:  Orders Placed This Encounter  Procedures  . Large Joint Inj   No orders of the defined types were placed in this encounter.     Procedures: Large Joint Inj: L knee on 02/15/2018 11:01 AM Indications: diagnostic evaluation and pain Details: 22 G 1.5 in needle, superolateral approach  Arthrogram: No  Medications: 3 mL lidocaine 1 %; 40 mg methylPREDNISolone acetate 40 MG/ML Outcome: tolerated well, no immediate complications Procedure, treatment alternatives, risks and benefits explained, specific risks discussed. Consent was given by the patient. Immediately prior to procedure a time out was called to verify the correct patient, procedure, equipment, support staff and site/side marked as required. Patient was prepped and draped in the usual sterile fashion.       Clinical Data: No additional findings.   Subjective: Chief Complaint  Patient presents with  . Left Knee - Follow-up  The patient is coming today with continued left knee pain with known osteoarthritis of the left knee.  She is had a previous right total knee arthroplasty.  She had a Synvisc 1 injection in early April of this year and this left knee.   She says did not help as much as she would like it to help.  She would like to have steroid injection today if it is okay.  HPI  Review of Systems She currently denies any headache, chest pain, shortness of breath, fever, chills, nausea, vomiting.  Objective: Vital Signs: There were no vitals taken for this visit.  Physical Exam She is alert and oriented x3 and in no acute distress Ortho Exam Examination of her left knee shows painful medial lateral joint line and patellofemoral crepitation but her range of motion is full.  There is no effusion.  Her left knee is ligamentously stable. Specialty Comments:  No specialty comments available.  Imaging: No results found.   PMFS History: Patient Active Problem List   Diagnosis Date Noted  . Nodule of left lung 01/29/2018  . Atherosclerosis of aorta (HCC) 01/29/2018  . Osteoporosis 11/13/2017  . Unilateral primary osteoarthritis, left knee 04/29/2017  . Routine physical examination 10/09/2016  . Osteoarthritis of right knee 12/13/2015  . Status post total right knee replacement 12/13/2015  . Osteoarthritis of knee 10/09/2015  . Medicare annual wellness visit, subsequent 06/02/2014  . Acute meniscal tear of left knee 06/02/2014  . Obesity (BMI 30-39.9) 06/02/2014  . Hair loss 06/02/2014  . Screening for breast cancer 06/02/2014  . HTN (hypertension) 07/04/2013   Past Medical History:  Diagnosis Date  .  Atrophic vaginitis   . Hypertension   . Menopause    After TAH, premarin 2-3 years, now occasional hot flashes  . Overweight(278.02)   . PONV (postoperative nausea and vomiting)   . Sinusitis     Family History  Problem Relation Age of Onset  . Ovarian cancer Mother   . Emphysema Father   . Heart disease Maternal Aunt   . Stroke Maternal Aunt   . Breast cancer Paternal Grandmother        in her 100's.     Past Surgical History:  Procedure Laterality Date  . KNEE ARTHROSCOPY Left 07/06/14   Dr. Magnus Ivan  torn meniscus    . TONSILLECTOMY AND ADENOIDECTOMY    . TOTAL ABDOMINAL HYSTERECTOMY W/ BILATERAL SALPINGOOPHORECTOMY  1991  . TOTAL KNEE ARTHROPLASTY Right 12/13/2015   Procedure: RIGHT TOTAL KNEE ARTHROPLASTY;  Surgeon: Kathryne Hitch, MD;  Location: [facility] OR;  Service: Orthopedics;  Laterality: Right;   Social History   Occupational History  . Occupation: [employer]    Employer: [employer]  Tobacco Use  . Smoking status: Never Smoker  . Smokeless tobacco: Never Used  Substance and Sexual Activity  . Alcohol use: Yes    Comment: Rarely  . Drug use: No  . Sexual activity: Not on file

## 2018-04-15 DIAGNOSIS — H524 Presbyopia: Secondary | ICD-10-CM | POA: Diagnosis not present

## 2018-05-11 ENCOUNTER — Other Ambulatory Visit: Payer: Self-pay | Admitting: Family

## 2018-05-19 DIAGNOSIS — R69 Illness, unspecified: Secondary | ICD-10-CM | POA: Diagnosis not present

## 2018-05-20 ENCOUNTER — Telehealth (INDEPENDENT_AMBULATORY_CARE_PROVIDER_SITE_OTHER): Payer: Self-pay

## 2018-05-20 ENCOUNTER — Encounter (INDEPENDENT_AMBULATORY_CARE_PROVIDER_SITE_OTHER): Payer: Self-pay | Admitting: Orthopaedic Surgery

## 2018-05-20 NOTE — Telephone Encounter (Signed)
Submitted VOb for SynviscOne, left knee. Appointment needs to be scheduled after 06/02/2018.

## 2018-05-24 ENCOUNTER — Ambulatory Visit (INDEPENDENT_AMBULATORY_CARE_PROVIDER_SITE_OTHER): Payer: Medicare HMO | Admitting: Orthopaedic Surgery

## 2018-05-25 ENCOUNTER — Telehealth (INDEPENDENT_AMBULATORY_CARE_PROVIDER_SITE_OTHER): Payer: Self-pay

## 2018-05-25 NOTE — Telephone Encounter (Signed)
PA required for SynviscOne, left knee.  Completed PA form faxed to [employer] at [phone].

## 2018-05-31 ENCOUNTER — Telehealth (INDEPENDENT_AMBULATORY_CARE_PROVIDER_SITE_OTHER): Payer: Self-pay

## 2018-05-31 NOTE — Telephone Encounter (Signed)
Patient is approved for SynviscOne, left knee. Buy & Bill Patient will be responsible for 20% OOP for medication. Covered at 100% after co-pay for injection. Co-pay $25.00 PA Required PA Approval# [id number] Valid 06/03/2018- 09/03/2018  Appt. 06/03/2018

## 2018-06-03 ENCOUNTER — Ambulatory Visit (INDEPENDENT_AMBULATORY_CARE_PROVIDER_SITE_OTHER): Payer: Medicare HMO | Admitting: Orthopaedic Surgery

## 2018-06-03 ENCOUNTER — Encounter (INDEPENDENT_AMBULATORY_CARE_PROVIDER_SITE_OTHER): Payer: Self-pay | Admitting: Orthopaedic Surgery

## 2018-06-03 DIAGNOSIS — M1712 Unilateral primary osteoarthritis, left knee: Secondary | ICD-10-CM

## 2018-06-03 MED ORDER — HYLAN G-F 20 48 MG/6ML IX SOSY
48.0000 mg | PREFILLED_SYRINGE | INTRA_ARTICULAR | Status: AC | PRN
  Administered 2018-06-03: 48 mg via INTRA_ARTICULAR

## 2018-06-03 NOTE — Progress Notes (Signed)
   Procedure Note  Patient: Karen Vaughn             Date of Birth: [birth year]           MRN: [id number]             Visit Date: 06/03/2018  Procedures: Visit Diagnoses: Unilateral primary osteoarthritis, left knee  Large Joint Inj: L knee on 06/03/2018 3:10 PM Indications: pain and diagnostic evaluation Details: 22 G 1.5 in needle, superolateral approach  Arthrogram: No  Medications: 48 mg Hylan 48 MG/6ML Outcome: tolerated well, no immediate complications Procedure, treatment alternatives, risks and benefits explained, specific risks discussed. Consent was given by the patient. Immediately prior to procedure a time out was called to verify the correct patient, procedure, equipment, support staff and site/side marked as required. Patient was prepped and draped in the usual sterile fashion.    Patient is here today for scheduled hyaluronic acid injection with Synvisc 1 in her left knee to treat the pain from osteoarthritis.  She has had these injections before and is fully aware the risk and benefits involved.  She has no significant effusion of her left knee.  She has a previous right total knee arthroplasty.  Her left knee is bending well and is ligamentously stable.  She tolerated the Synvisc 1 injection well in the left knee without any difficulties.  All question concerns were answered and addressed.  Follow-up will now be as needed we can always see her back that is worsening in any way.

## 2018-07-01 ENCOUNTER — Telehealth: Payer: Self-pay | Admitting: Family

## 2018-07-01 NOTE — Telephone Encounter (Signed)
Pt came into office.

## 2018-07-01 NOTE — Telephone Encounter (Signed)
Copied from CRM [id number]. Topic: Quick Communication - See Telephone Encounter >> Jul 01, 2018  3:21 PM Jens Som A wrote: CRM for notification. See Telephone encounter for: 07/01/18.  Patient is calling to request a copy of her Medicare card with out the social security number [phone]

## 2018-08-12 ENCOUNTER — Other Ambulatory Visit (INDEPENDENT_AMBULATORY_CARE_PROVIDER_SITE_OTHER): Payer: Self-pay | Admitting: Orthopaedic Surgery

## 2018-10-04 ENCOUNTER — Other Ambulatory Visit: Payer: Self-pay | Admitting: Family

## 2018-10-04 ENCOUNTER — Telehealth: Payer: Self-pay | Admitting: Family

## 2018-10-04 DIAGNOSIS — R911 Solitary pulmonary nodule: Secondary | ICD-10-CM

## 2018-10-04 NOTE — Telephone Encounter (Signed)
I spoke with patient & she stated that before she started in the program she wanted to see what CT says.

## 2018-10-04 NOTE — Telephone Encounter (Signed)
Call pt We have ordered repeat ct chest wo constrast due to the lung nodule seen last year.   Karen Vaughn is working on this; please advise pt to call us if doesn't hear from [place] regarding this   Also, I have referred her to the new program run through oncology which tracks pulmonary nodules and provides surveillance. We want to ensure stability of lung nodules.  They will be in contact with you. If you do not hear from them , please call the office and let me know.

## 2018-10-06 NOTE — Telephone Encounter (Signed)
noted 

## 2018-10-07 ENCOUNTER — Other Ambulatory Visit: Payer: Self-pay | Admitting: Oncology

## 2018-10-07 NOTE — Progress Notes (Signed)
  Pulmonary Nodule Clinic Telephone Note  Received referral from PCP, Rennie Plowman, FNP.   Per most recent guidelines and recommendations from Fleischner Society (2017), this patient requires a CT scan without contrast, 3-6 months from last scan and follow-up visit in the pulmonary nodule clinic a few days later.    I have personally reviewed all patient's previous imaging. Last CT scan completed on 01/27/18 revealed 7 mm sub-solid left lower lobe pulmonary nodule.  No previous imaging to compare. Per Fleischner guidelines (2017), CT scan without contrast is recommended in 3 to 6 months and if stable (remiaing < 6 mm), a repeat CT scan at 18 to 24 months (from first scan) to ensure stability in high risk patients for a total of 5 years.  High risk factors include: History of heavy smoking, exposure to asbestos, radium or uranium, personal family history of lung cancer, older age, sex (females greater than males), race (black and native [place] greater than weight), marginal speculation, upper lobe location, multiplicity (less than 5 nodules increases risk for malignancy) and emphysema and/or pulmonary fibrosis.   This recommendation follows the consensus statement: Guidelines for Management of Incidental Pulmonary Nodules Detected on CT Images: From the Fleischner Society 2017; Radiology 2017; 284:228-243.    The scan without contrast ordered by PCP and is scheduled for 10/14/2018; approximately 8 months since previous. I would like her to see me in our Pulmonary Nodule Clinic after her CT scan on scheduled clinic day (Friday Morning).   Scheduling has been notified of appointment request and will call patient with appointment   Durenda Hurt, NP 10/06/2018 2:22 PM

## 2018-10-07 NOTE — Progress Notes (Signed)
Patient wishes to wait until she receives results from CT scan scheduled for 10/14/2018 before committing to being seen at the pulmonary nodule clinic.  Patient tentatively scheduled for 10/15/2018 at 8:30 AM should she decide to accept appt.  Durenda Hurt, NP 10/07/2018 12:17 PM

## 2018-10-12 ENCOUNTER — Encounter: Payer: Self-pay | Admitting: Family

## 2018-10-14 ENCOUNTER — Ambulatory Visit
Admission: RE | Admit: 2018-10-14 | Discharge: 2018-10-14 | Disposition: A | Discharge status: Home / Self Care | Payer: Medicare HMO | Source: Ambulatory Visit | Attending: Family | Admitting: Family

## 2018-10-14 DIAGNOSIS — R911 Solitary pulmonary nodule: Secondary | ICD-10-CM | POA: Diagnosis not present

## 2018-10-14 DIAGNOSIS — I712 Thoracic aortic aneurysm, without rupture: Secondary | ICD-10-CM | POA: Diagnosis not present

## 2018-10-15 ENCOUNTER — Encounter: Payer: Self-pay | Admitting: Family

## 2018-10-15 ENCOUNTER — Inpatient Hospital Stay: Payer: Medicare HMO | Admitting: Oncology

## 2018-10-22 ENCOUNTER — Encounter: Payer: Self-pay | Admitting: Family

## 2018-10-22 ENCOUNTER — Ambulatory Visit (INDEPENDENT_AMBULATORY_CARE_PROVIDER_SITE_OTHER): Payer: Medicare HMO | Admitting: Family

## 2018-10-22 VITALS — BP 132/82 | HR 60 | Temp 97.7°F | Ht 61.25 in | Wt 160.0 lb

## 2018-10-22 DIAGNOSIS — I712 Thoracic aortic aneurysm, without rupture, unspecified: Secondary | ICD-10-CM | POA: Insufficient documentation

## 2018-10-22 DIAGNOSIS — Z0000 Encounter for general adult medical examination without abnormal findings: Secondary | ICD-10-CM

## 2018-10-22 DIAGNOSIS — R911 Solitary pulmonary nodule: Secondary | ICD-10-CM | POA: Diagnosis not present

## 2018-10-22 LAB — LIPID PANEL
Cholesterol: 153 mg/dL (ref 0–200)
HDL: 77 mg/dL (ref >39.00)
LDL Cholesterol: 65 mg/dL (ref 0–99)
NONHDL: 76.04
TRIGLYCERIDES: 56 mg/dL (ref 0.0–149.0)
Total CHOL/HDL Ratio: 2
VLDL: 11.2 mg/dL (ref 0.0–40.0)

## 2018-10-22 LAB — COMPREHENSIVE METABOLIC PANEL
ALBUMIN: 4.3 g/dL (ref 3.5–5.2)
ALT: 31 U/L (ref 0–35)
AST: 25 U/L (ref 0–37)
Alkaline Phosphatase: 80 U/L (ref 39–117)
BUN: 25 mg/dL — AB (ref 6–23)
CHLORIDE: 103 meq/L (ref 96–112)
CO2: 28 mEq/L (ref 19–32)
Calcium: 9.7 mg/dL (ref 8.4–10.5)
Creatinine, Ser: 0.93 mg/dL (ref 0.40–1.20)
GFR: 59.68 mL/min — ABNORMAL LOW (ref >60.00)
Glucose, Bld: 100 mg/dL — ABNORMAL HIGH (ref 70–99)
POTASSIUM: 3.5 meq/L (ref 3.5–5.1)
Sodium: 139 mEq/L (ref 135–145)
Total Bilirubin: 0.5 mg/dL (ref 0.2–1.2)
Total Protein: 6.9 g/dL (ref 6.0–8.3)

## 2018-10-22 LAB — TSH: TSH: 2.29 u[IU]/mL (ref 0.35–4.50)

## 2018-10-22 LAB — CBC WITH DIFFERENTIAL/PLATELET
BASOS PCT: 1.1 % (ref 0.0–3.0)
Basophils Absolute: 0.1 10*3/uL (ref 0.0–0.1)
EOS PCT: 1.9 % (ref 0.0–5.0)
Eosinophils Absolute: 0.1 10*3/uL (ref 0.0–0.7)
HCT: 40.9 % (ref 36.0–46.0)
Hemoglobin: 13.6 g/dL (ref 12.0–15.0)
LYMPHS ABS: 1.7 10*3/uL (ref 0.7–4.0)
Lymphocytes Relative: 29.8 % (ref 12.0–46.0)
MCHC: 33.1 g/dL (ref 30.0–36.0)
MCV: 88 fl (ref 78.0–100.0)
MONO ABS: 0.4 10*3/uL (ref 0.1–1.0)
Monocytes Relative: 7.7 % (ref 3.0–12.0)
NEUTROS PCT: 59.5 % (ref 43.0–77.0)
Neutro Abs: 3.4 10*3/uL (ref 1.4–7.7)
PLATELETS: 242 10*3/uL (ref 150.0–400.0)
RBC: 4.65 Mil/uL (ref 3.87–5.11)
RDW: 13.8 % (ref 11.5–15.5)
WBC: 5.8 10*3/uL (ref 4.0–10.5)

## 2018-10-22 LAB — VITAMIN D 25 HYDROXY (VIT D DEFICIENCY, FRACTURES): VITD: 44.68 ng/mL (ref 30.00–100.00)

## 2018-10-22 LAB — HEMOGLOBIN A1C: HEMOGLOBIN A1C: 5.5 % (ref 4.6–6.5)

## 2018-10-22 NOTE — Assessment & Plan Note (Signed)
Referral to cardiac thoracic surgeon

## 2018-10-22 NOTE — Assessment & Plan Note (Signed)
Advised to repeat in 6 months, patient will call the office to ensure the CT chest is ordered, scheduled.  In regards incidental finding of a sending thoracic aneurysm, patient referred to see cardiac thoracic surgeon versus vascular.  I made that referral for her today.  She will contact the office if an appointment is not scheduled.

## 2018-10-22 NOTE — Assessment & Plan Note (Addendum)
Clinical breast exam performed, deferred pelvic exam in the absence complaints and patient has a history of hysterectomy.  She declines continuing screening mammograms.  Advised Tdap and local pharmacy

## 2018-10-22 NOTE — Patient Instructions (Signed)
So nice to see you  Please call the office in 6 months so we can order your CT chest follow-up scan.  Today we discussed referrals, orders.  Thoracic cardiac surgery  I have placed these orders in the system for you.  Please be sure to give [place] a call if you have not heard from our office regarding this. We should hear from [place] within ONE week with information regarding your appointment. If not, please let me know immediately.     Health Maintenance for Postmenopausal Women Menopause is a normal process in which your reproductive ability comes to an end. This process happens gradually over a span of months to years, usually between the ages of 48 and 57. Menopause is complete when you have missed 12 consecutive menstrual periods. It is important to talk with your health care provider about some of the most common conditions that affect postmenopausal women, such as heart disease, cancer, and bone loss (osteoporosis). Adopting a healthy lifestyle and getting preventive care can help to promote your health and wellness. Those actions can also lower your chances of developing some of these common conditions. What should I know about menopause? During menopause, you may experience a number of symptoms, such as:  Moderate-to-severe hot flashes.  Night sweats.  Decrease in sex drive.  Mood swings.  Headaches.  Tiredness.  Irritability.  Memory problems.  Insomnia. Choosing to treat or not to treat menopausal changes is an individual decision that you make with your health care provider. What should I know about hormone replacement therapy and supplements? Hormone therapy products are effective for treating symptoms that are associated with menopause, such as hot flashes and night sweats. Hormone replacement carries certain risks, especially as you become older. If you are thinking about using estrogen or estrogen with progestin treatments, discuss the benefits and risks with your health  care provider. What should I know about heart disease and stroke? Heart disease, heart attack, and stroke become more likely as you age. This may be due, in part, to the hormonal changes that your body experiences during menopause. These can affect how your body processes dietary fats, triglycerides, and cholesterol. Heart attack and stroke are both medical emergencies. There are many things that you can do to help prevent heart disease and stroke:  Have your blood pressure checked at least every 1-2 years. High blood pressure causes heart disease and increases the risk of stroke.  If you are 63-67 years old, ask your health care provider if you should take aspirin to prevent a heart attack or a stroke.  Do not use any tobacco products, including cigarettes, chewing tobacco, or electronic cigarettes. If you need help quitting, ask your health care provider.  It is important to eat a healthy diet and maintain a healthy weight. ? Be sure to include plenty of vegetables, fruits, low-fat dairy products, and lean protein. ? Avoid eating foods that are high in solid fats, added sugars, or salt (sodium).  Get regular exercise. This is one of the most important things that you can do for your health. ? Try to exercise for at least 150 minutes each week. The type of exercise that you do should increase your heart rate and make you sweat. This is known as moderate-intensity exercise. ? Try to do strengthening exercises at least twice each week. Do these in addition to the moderate-intensity exercise.  Know your numbers.Ask your health care provider to check your cholesterol and your blood glucose. Continue to have  your blood tested as directed by your health care provider.  What should I know about cancer screening? There are several types of cancer. Take the following steps to reduce your risk and to catch any cancer development as early as possible. Breast Cancer  Practice breast  self-awareness. ? This means understanding how your breasts normally appear and feel. ? It also means doing regular breast self-exams. Let your health care provider know about any changes, no matter how small.  If you are 40 or older, have a clinician do a breast exam (clinical breast exam or CBE) every year. Depending on your age, family history, and medical history, it may be recommended that you also have a yearly breast X-ray (mammogram).  If you have a family history of breast cancer, talk with your health care provider about genetic screening.  If you are at high risk for breast cancer, talk with your health care provider about having an MRI and a mammogram every year.  Breast cancer (BRCA) gene test is recommended for women who have family members with BRCA-related cancers. Results of the assessment will determine the need for genetic counseling and BRCA1 and for BRCA2 testing. BRCA-related cancers include these types: ? Breast. This occurs in males or females. ? Ovarian. ? Tubal. This may also be called fallopian tube cancer. ? Cancer of the abdominal or pelvic lining (peritoneal cancer). ? Prostate. ? Pancreatic. Cervical, Uterine, and Ovarian Cancer Your health care provider may recommend that you be screened regularly for cancer of the pelvic organs. These include your ovaries, uterus, and vagina. This screening involves a pelvic exam, which includes checking for microscopic changes to the surface of your cervix (Pap test).  For women ages 21-65, health care providers may recommend a pelvic exam and a Pap test every three years. For women ages 30-65, they may recommend the Pap test and pelvic exam, combined with testing for human papilloma virus (HPV), every five years. Some types of HPV increase your risk of cervical cancer. Testing for HPV may also be done on women of any age who have unclear Pap test results.  Other health care providers may not recommend any screening for  nonpregnant women who are considered low risk for pelvic cancer and have no symptoms. Ask your health care provider if a screening pelvic exam is right for you.  If you have had past treatment for cervical cancer or a condition that could lead to cancer, you need Pap tests and screening for cancer for at least 20 years after your treatment. If Pap tests have been discontinued for you, your risk factors (such as having a new sexual partner) need to be reassessed to determine if you should start having screenings again. Some women have medical problems that increase the chance of getting cervical cancer. In these cases, your health care provider may recommend that you have screening and Pap tests more often.  If you have a family history of uterine cancer or ovarian cancer, talk with your health care provider about genetic screening.  If you have vaginal bleeding after reaching menopause, tell your health care provider.  There are currently no reliable tests available to screen for ovarian cancer. Lung Cancer Lung cancer screening is recommended for adults 55-80 years old who are at high risk for lung cancer because of a history of smoking. A yearly low-dose CT scan of the lungs is recommended if you:  Currently smoke.  Have a history of at least 30 pack-years of smoking and   you currently smoke or have quit within the past 15 years. A pack-year is smoking an average of one pack of cigarettes per day for one year. Yearly screening should:  Continue until it has been 15 years since you quit.  Stop if you develop a health problem that would prevent you from having lung cancer treatment. Colorectal Cancer  This type of cancer can be detected and can often be prevented.  Routine colorectal cancer screening usually begins at age 50 and continues through age 75.  If you have risk factors for colon cancer, your health care provider may recommend that you be screened at an earlier age.  If you have  a family history of colorectal cancer, talk with your health care provider about genetic screening.  Your health care provider may also recommend using home test kits to check for hidden blood in your stool.  A small camera at the end of a tube can be used to examine your colon directly (sigmoidoscopy or colonoscopy). This is done to check for the earliest forms of colorectal cancer.  Direct examination of the colon should be repeated every 5-10 years until age 75. However, if early forms of precancerous polyps or small growths are found or if you have a family history or genetic risk for colorectal cancer, you may need to be screened more often. Skin Cancer  Check your skin from head to toe regularly.  Monitor any moles. Be sure to tell your health care provider: ? About any new moles or changes in moles, especially if there is a change in a mole's shape or color. ? If you have a mole that is larger than the size of a pencil eraser.  If any of your family members has a history of skin cancer, especially at a young age, talk with your health care provider about genetic screening.  Always use sunscreen. Apply sunscreen liberally and repeatedly throughout the day.  Whenever you are outside, protect yourself by wearing long sleeves, pants, a wide-brimmed hat, and sunglasses. What should I know about osteoporosis? Osteoporosis is a condition in which bone destruction happens more quickly than new bone creation. After menopause, you may be at an increased risk for osteoporosis. To help prevent osteoporosis or the bone fractures that can happen because of osteoporosis, the following is recommended:  If you are 19-50 years old, get at least 1,000 mg of calcium and at least 600 mg of vitamin D per day.  If you are older than age 50 but younger than age 70, get at least 1,200 mg of calcium and at least 600 mg of vitamin D per day.  If you are older than age 70, get at least 1,200 mg of calcium and  at least 800 mg of vitamin D per day. Smoking and excessive alcohol intake increase the risk of osteoporosis. Eat foods that are rich in calcium and vitamin D, and do weight-bearing exercises several times each week as directed by your health care provider. What should I know about how menopause affects my mental health? Depression may occur at any age, but it is more common as you become older. Common symptoms of depression include:  Low or sad mood.  Changes in sleep patterns.  Changes in appetite or eating patterns.  Feeling an overall lack of motivation or enjoyment of activities that you previously enjoyed.  Frequent crying spells. Talk with your health care provider if you think that you are experiencing depression. What should I know about immunizations?   It is important that you get and maintain your immunizations. These include:  Tetanus, diphtheria, and pertussis (Tdap) booster vaccine.  Influenza every year before the flu season begins.  Pneumonia vaccine.  Shingles vaccine. Your health care provider may also recommend other immunizations. This information is not intended to replace advice given to you by your health care provider. Make sure you discuss any questions you have with your health care provider. Document Released: 10/03/2005 Document Revised: 02/29/2016 Document Reviewed: 05/15/2015 Elsevier Interactive Patient Education  2019 [employer]vier Inc.  

## 2018-10-22 NOTE — Progress Notes (Signed)
Subjective:    Patient ID: Karen Vaughn, female    DOB: Nov 22, 1950, 68 y.o.   MRN: [id number]  CC: Karen Vaughn is a 68 y.o. female who presents today for physical exam.    HPI: Feels well today.  No complaints  Pleased with weight loss. Working with Psychologist, educational.   Blood pressure at home 110/80.   On 81mg  asa.   Diarrhea resolved.   Denies exertional chest pain or pressure, numbness or tingling radiating to left arm or jaw, palpitations, dizziness, frequent headaches, changes in vision, or shortness of breath.    CT chest-repeat in 6 months; repeat again in 18-24 months from CT chest. Ascending thoracic aneurysm       Colorectal Cancer Screening: UTD , cologuard negative 2019 Breast Cancer Screening: Mammogram due; declines.  Cervical Cancer Screening: History of hysterectomy.  No vaginal bleeding Bone Health screening/DEXA for 65+: UTD Lung Cancer Screening: participating.  Labs: Screening labs today. Exercise: Gets regular exercise.  Alcohol use: rare  Smoking/tobacco use: Nonsmoker.  Wears seat belt: Yes. Skin: no new lesions.    HISTORY:  Past Medical History:  Diagnosis Date  . Atrophic vaginitis   . Hypertension   . Menopause    After TAH, premarin 2-3 years, now occasional hot flashes  . Overweight(278.02)   . PONV (postoperative nausea and vomiting)   . Sinusitis     Past Surgical History:  Procedure Laterality Date  . KNEE ARTHROSCOPY Left 07/06/14   Dr. Magnus Ivan  torn meniscus   . TONSILLECTOMY AND ADENOIDECTOMY    . TOTAL ABDOMINAL HYSTERECTOMY W/ BILATERAL SALPINGOOPHORECTOMY  1991  . TOTAL KNEE ARTHROPLASTY Right 12/13/2015   Procedure: RIGHT TOTAL KNEE ARTHROPLASTY;  Surgeon: Kathryne Hitch, MD;  Location: [facility] OR;  Service: Orthopedics;  Laterality: Right;   Family History  Problem Relation Age of Onset  . Ovarian cancer Mother   . Emphysema Father   . Heart disease Maternal Aunt   . Stroke Maternal Aunt   . Breast cancer  Paternal Grandmother        in her 37's.       ALLERGIES: Patient has no known allergies.  Current Outpatient Medications on File Prior to Visit  Medication Sig Dispense Refill  . aspirin 81 MG tablet Take 81 mg by mouth daily.    . Multiple Vitamin (MULTIVITAMIN) tablet Take 1 tablet by mouth daily.    Marland Kitchen OLIVE LEAF EXTRACT PO Take 1 capsule by mouth.     No current facility-administered medications on file prior to visit.     Social History   Tobacco Use  . Smoking status: Never Smoker  . Smokeless tobacco: Never Used  Substance Use Topics  . Alcohol use: Yes    Comment: Rarely  . Drug use: No    Review of Systems  Constitutional: Negative for chills, fever and unexpected weight change.  HENT: Negative for congestion.   Respiratory: Negative for cough.   Cardiovascular: Negative for chest pain, palpitations and leg swelling.  Gastrointestinal: Negative for nausea and vomiting.  Musculoskeletal: Negative for arthralgias and myalgias.  Skin: Negative for rash.  Neurological: Negative for headaches.  Hematological: Negative for adenopathy.  Psychiatric/Behavioral: Negative for confusion.      Objective:    BP 132/82 (BP Location: Left Arm, Patient Position: Sitting, Cuff Size: Large)   Pulse 60   Temp 97.7 F (36.5 C)   Ht 5' 1.25" (1.556 m)   Wt 160 lb (72.6 kg)   SpO2 98%  BMI 29.99 kg/m   BP Readings from Last 3 Encounters:  10/22/18 132/82  01/27/18 118/74  11/30/17 112/62   Wt Readings from Last 3 Encounters:  10/22/18 160 lb (72.6 kg)  01/27/18 181 lb 4 oz (82.2 kg)  11/30/17 184 lb (83.5 kg)    Physical Exam Vitals signs reviewed.  Constitutional:      Appearance: She is well-developed.  Eyes:     Conjunctiva/sclera: Conjunctivae normal.  Neck:     Thyroid: No thyroid mass or thyromegaly.  Cardiovascular:     Rate and Rhythm: Normal rate and regular rhythm.     Pulses: Normal pulses.     Heart sounds: Normal heart sounds.  Pulmonary:      Effort: Pulmonary effort is normal.     Breath sounds: Normal breath sounds. No wheezing, rhonchi or rales.  Chest:     Breasts: Breasts are symmetrical.        Right: No inverted nipple, mass, nipple discharge, skin change or tenderness.        Left: No inverted nipple, mass, nipple discharge, skin change or tenderness.  Lymphadenopathy:     Head:     Right side of head: No submental, submandibular, tonsillar, preauricular, posterior auricular or occipital adenopathy.     Left side of head: No submental, submandibular, tonsillar, preauricular, posterior auricular or occipital adenopathy.     Cervical: No cervical adenopathy.     Right cervical: No superficial, deep or posterior cervical adenopathy.    Left cervical: No superficial, deep or posterior cervical adenopathy.  Skin:    General: Skin is warm and dry.  Neurological:     Mental Status: She is alert.  Psychiatric:        Speech: Speech normal.        Behavior: Behavior normal.        Thought Content: Thought content normal.        Assessment & Plan:   Problem List Items Addressed This Visit      Cardiovascular and Mediastinum   Thoracic aortic aneurysm without rupture [facility])    Referral to cardiac thoracic surgeon      Relevant Orders   Ambulatory referral to Cardiothoracic Surgery     Other   Routine physical examination - Primary    Clinical breast exam performed, deferred pelvic exam in the absence complaints and patient has a history of hysterectomy.  She declines continuing screening mammograms.  Advised Tdap and local pharmacy      Relevant Orders   TSH   CBC with Differential/Platelet   Comprehensive metabolic panel   Hemoglobin A1c   Lipid panel   VITAMIN D 25 Hydroxy (Vit-D Deficiency, Fractures)   Nodule of left lung    Advised to repeat in 6 months, patient will call the office to ensure the CT chest is ordered, scheduled.  In regards incidental finding of a sending thoracic aneurysm, patient  referred to see cardiac thoracic surgeon versus vascular.  I made that referral for her today.  She will contact the office if an appointment is not scheduled.          I have discontinued [name]. Schwimmer's docusate sodium, calcium carbonate, ciprofloxacin, metroNIDAZOLE, and hydrochlorothiazide. I am also having her maintain her aspirin, multivitamin, and OLIVE LEAF EXTRACT PO.   No orders of the defined types were placed in this encounter.   Return precautions given.   Risks, benefits, and alternatives of the medications and treatment plan prescribed today were discussed, and  patient expressed understanding.   Education regarding symptom management and diagnosis given to patient on AVS.   Continue to follow with Allegra Grana, FNP for routine health maintenance.   Little Ishikawa and I agreed with plan.   Rennie Plowman, FNP

## 2018-10-30 ENCOUNTER — Encounter: Payer: Self-pay | Admitting: Family

## 2018-11-17 ENCOUNTER — Encounter (INDEPENDENT_AMBULATORY_CARE_PROVIDER_SITE_OTHER): Payer: Medicare HMO | Admitting: Family

## 2018-11-17 DIAGNOSIS — J0100 Acute maxillary sinusitis, unspecified: Secondary | ICD-10-CM

## 2018-11-17 MED ORDER — AMOXICILLIN-POT CLAVULANATE 875-125 MG PO TABS: 1.0000 | ORAL_TABLET | Freq: Two times a day (BID) | ORAL | 0 refills | Status: DC

## 2018-11-17 NOTE — Telephone Encounter (Signed)
Verbal consent for services obtained from patient prior to services given.  Names of all persons present for services: Rennie Plowman, NP Chief complaint:  Sinus pressure for a couple of weeks, past couple of days developed low grade temperature.  Tmax 99.5. no cough, chest pain, sob, ear pain, sore throat. Taking sudafed with relief.  This 'happens once per year.'   Fever resolved on tylenol.   Augmentin usually helps.   History, background, results pertinent:  H/o sinus infection once per year  A/P/next steps:   Cc to [facility]

## 2018-11-19 NOTE — Telephone Encounter (Signed)
Of note: Encounter closed prior to my completion of my note.  Please note addendum below   This visit type was conducted due to national recommendations for restrictions regarding the COVID-19 pandemic (e.g. social distancing).  This format is felt to be most appropriate for this patient at this time.  All issues noted in this document were discussed and addressed.  No physical exam was performed.  verbal consent for services obtained from patient prior to services given.  Names of all persons present for services: Rennie Plowman, NP Chief complaint:  Sinus pressure for a couple of weeks, unchanged.  Over the past couple of days developed low grade temperature.  Tmax 99.5.  no cough, chest pain, sob, ear pain, sore throat. Taking sudafed with relief.   This 'happens once per year.'   Fever resolved on tylenol.   Augmentin usually helps.  Does not feel that she needs higher level of care such as emergency room   History, background, results pertinent:  H/o sinus infection once per year  A/P/next steps: Suspected bacterial sinusitis Based on duration of symptoms, I think it is reasonable to start antibiotic therapy.  Patient is not clinically febrile.  No shortness of breath.  On the phone she was not labored in her speech, talking in complete sentences. She will let me know how she is doing from home.

## 2018-11-24 ENCOUNTER — Encounter: Payer: Medicare HMO | Admitting: Surgery

## 2018-12-02 ENCOUNTER — Ambulatory Visit: Payer: Medicare HMO

## 2018-12-08 ENCOUNTER — Encounter: Payer: Medicare HMO | Admitting: Surgery

## 2018-12-10 NOTE — Telephone Encounter (Signed)
I spent 15 min non face to face w/ pt.   Karen Janes, np

## 2018-12-20 ENCOUNTER — Other Ambulatory Visit: Payer: Self-pay

## 2018-12-20 ENCOUNTER — Ambulatory Visit (INDEPENDENT_AMBULATORY_CARE_PROVIDER_SITE_OTHER): Payer: Medicare HMO

## 2018-12-20 VITALS — BP 110/80 | HR 62 | Ht 61.0 in | Wt 151.0 lb

## 2018-12-20 DIAGNOSIS — Z0000 Encounter for general adult medical examination without abnormal findings: Secondary | ICD-10-CM | POA: Diagnosis not present

## 2018-12-20 NOTE — Patient Instructions (Addendum)
  Karen Vaughn , Thank you for taking time to come for your Medicare Wellness Visit. I appreciate your ongoing commitment to your health goals. Please review the following plan we discussed and let me know if I can assist you in the future.   These are the goals we discussed: Goals      Patient Stated   . Weight (lb) < 150 lb (68 kg) (pt-stated)     Weight between 130-135lb within the 1 year.       This is a list of the screening recommended for you and due dates:  Health Maintenance  Topic Date Due  . Flu Shot  03/26/2019  . Cologuard (Stool DNA test)  12/05/2020  . Tetanus Vaccine  10/29/2028  . DEXA scan (bone density measurement)  Completed  .  Hepatitis C: One time screening is recommended by Center for Disease Control  (CDC) for  adults born from [birth year] through 1967.   Completed  . Pneumonia vaccines  Completed  . Mammogram  Discontinued

## 2018-12-20 NOTE — Progress Notes (Addendum)
Subjective:   Karen Vaughn is a 68 y.o. female who presents for Medicare Annual (Subsequent) preventive examination.  Review of Systems:  No ROS.  Medicare Wellness Visit. Additional risk factors are reflected in the social history. Cardiac Risk Factors include: advanced age (>4555men, 85>65 women);hypertension     Objective:     Vitals: BP 110/80 Comment: Deferred due to virtual visit  Pulse 62   Ht 5\' 1"  (1.549 m)   Wt 151 lb (68.5 kg)   BMI 28.53 kg/m   Body mass index is 28.53 kg/m.    She reports vital signs taken at home  Advanced Directives 12/20/2018 11/30/2017 12/13/2015  Does Patient Have a Medical Advance Directive? No Yes No  Type of Advance Directive - Living will -  Does patient want to make changes to medical advance directive? - No - Patient declined -  Would patient like information on creating a medical advance directive? No - Patient declined - No - patient declined information    Tobacco Social History   Tobacco Use  Smoking Status Never Smoker  Smokeless Tobacco Never Used     Counseling given: Not Answered   Clinical Intake:  Pre-visit preparation completed: Yes        Diabetes: No  How often do you need to have someone help you when you read instructions, pamphlets, or other written materials from your doctor or pharmacy?: 1 - Never  Interpreter Needed?: No     Past Medical History:  Diagnosis Date  . Atrophic vaginitis   . Hypertension   . Menopause    After TAH, premarin 2-3 years, now occasional hot flashes  . Overweight(278.02)   . PONV (postoperative nausea and vomiting)   . Sinusitis    Past Surgical History:  Procedure Laterality Date  . KNEE ARTHROSCOPY Left 07/06/14   Dr. Magnus IvanBlackman  torn meniscus   . TONSILLECTOMY AND ADENOIDECTOMY    . TOTAL ABDOMINAL HYSTERECTOMY W/ BILATERAL SALPINGOOPHORECTOMY  1991  . TOTAL KNEE ARTHROPLASTY Right 12/13/2015   Procedure: RIGHT TOTAL KNEE ARTHROPLASTY;  Surgeon: Kathryne Hitchhristopher Y  Blackman, MD;  [facility];  Service: Orthopedics;  Laterality: Right;   Family History  Problem Relation Age of Onset  . Ovarian cancer Mother   . Emphysema Father   . Heart disease Maternal Aunt   . Stroke Maternal Aunt   . Breast cancer Paternal Grandmother        in her 3320's.    Social History   Socioeconomic History  . Marital status: Married    Spouse name: Not on file  . Number of children: 0  . Years of education: Not on file  . Highest education level: Not on file  Occupational History  . Occupation: [employer]    Employer: [employer]  Social Needs  . Financial resource strain: Not hard at all  . Food insecurity:    Worry: Never true    Inability: Never true  . Transportation needs:    Medical: No    Non-medical: No  Tobacco Use  . Smoking status: Never Smoker  . Smokeless tobacco: Never Used  Substance and Sexual Activity  . Alcohol use: Yes    Comment: Rarely  . Drug use: No  . Sexual activity: Not on file  Lifestyle  . Physical activity:    Days per week: 3 days    Minutes per session: 60 min  . Stress: Not at all  Relationships  . Social connections:  Talks on phone: Not on file    Gets together: Not on file    Attends religious service: Not on file    Active member of club or organization: Not on file    Attends meetings of clubs or organizations: Not on file    Relationship status: Not on file  Other Topics Concern  . Not on file  Social History Narrative   Married.       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     Outpatient Encounter Medications as of 12/20/2018  Medication Sig  . aspirin 81 MG tablet Take 81 mg by mouth daily.  . Multiple Vitamin (MULTIVITAMIN) tablet Take 1 tablet by mouth daily.  Marland Kitchen OLIVE LEAF EXTRACT PO Take 1 capsule by mouth.  . [DISCONTINUED] amoxicillin-clavulanate (AUGMENTIN) 875-125 MG tablet Take 1 tablet by mouth 2 (two) times daily.    No facility-administered encounter medications on file as of 12/20/2018.     Activities of Daily Living In your present state of health, do you have any difficulty performing the following activities: 12/20/2018  Hearing? N  Vision? N  Difficulty concentrating or making decisions? N  Walking or climbing stairs? N  Dressing or bathing? N  Doing errands, shopping? N  Preparing Food and eating ? N  Using the Toilet? N  In the past six months, have you accidently leaked urine? N  Do you have problems with loss of bowel control? N  Managing your Medications? N  Managing your Finances? N  Housekeeping or managing your Housekeeping? N  Some recent data might be hidden    Patient Care Team: Allegra Grana, FNP as PCP - General (Family Medicine)    Assessment:   This is a routine wellness examination for [place].  I connected with patient 12/20/18 at  3:00 PM EDT by a video enabled telemedicine application and verified that I am speaking with the correct person using two identifiers. Patient stated full name and DOB. Patient gave permission to continue with virtual visit. Patient's location was at home and Nurse's location was at [place] office.   Health Screenings  Mammogram -06/02/12 Colonoscopy -06/02/10 Bone Density -11/05/17 Glaucoma -none Hearing -demonstrates normal hearing during conversation. Hemoglobin A1C -10/22/18 (5.5) Cholesterol -10/22/18 (153) Dental- every 6 months Vision- every 12 months  Social  Alcohol intake -yes, rarely Smoking history- never Smokers in home? none Illicit drug use? none Exercise - core training 3 days weekly, 60 minutes Diet -high protein  Safety  Patient feels safe at home.  Patient does have smoke detectors at home  Patient does wear sunscreen or protective clothing when in direct sunlight  Patient does wear seat belt when driving or riding with others.   Activities of Daily Living Patient can do their own household chores.  Denies needing assistance with: driving, feeding themselves, getting from bed to chair, getting to the toilet, bathing/showering, dressing, managing money, climbing flight of stairs, or preparing meals.   Depression Screen Patient denies losing interest in daily life, feeling hopeless, or crying easily over simple problems.   Fall Screen Patient denies being afraid of falling or falling in the last year.   Memory Screen Patient denies problems with memory, misplacing items, and is able to balance checkbook/bank accounts.  Patient is alert, normal appearance, oriented to person/place/and time. Correctly identified the president of the [place], recall of 2/3 objects, and performing simple calculations.  Patient displays appropriate judgement and can  read correct time from watch face.   She reads, sodoku for brain stimulation.   Immunizations The following Immunizations were discussed: Influenza, shingles, pneumonia, and tetanus.   Other Providers Patient Care Team: Allegra Grana, FNP as PCP - General (Family Medicine)  Exercise Activities and Dietary recommendations Current Exercise Habits: Home exercise routine, Type of exercise: stretching;strength training/weights, Time (Minutes): 60, Frequency (Times/Week): 3, Weekly Exercise (Minutes/Week): 180, Intensity: Moderate  Goals      Patient Stated   . Weight (lb) < 150 lb (68 kg) (pt-stated)     Weight between 130-135lb within the 1 year.       Fall Risk Fall Risk  12/20/2018 11/30/2017 10/09/2016 06/02/2014  Falls in the past year? 0 No No No   Depression Screen PHQ 2/9 Scores 12/20/2018 11/30/2017 10/09/2016 06/02/2014  PHQ - 2 Score 0 0 0 0     Cognitive Function MMSE - Mini Mental State Exam 11/30/2017  Orientation to time 5  Orientation to Place 5  Registration 3  Attention/ Calculation 5  Recall 3  Language- name 2 objects 2  Language- repeat 1  Language- follow 3 step command 3  Language- read & follow direction 1   Write a sentence 1  Copy design 1  Total score 30     6CIT Screen 12/20/2018  What Year? 0 points  What month? 0 points  What time? 0 points  Count back from 20 0 points  Months in reverse 0 points  Repeat phrase 0 points  Total Score 0    Immunization History  Administered Date(s) Administered  . Hep A / Hep B 03/08/2012, 05/09/2012, 09/08/2012  . Influenza Split 04/26/2011  . Influenza Whole 05/19/2018  . Influenza,inj,Quad PF,6+ Mos 06/02/2014  . Influenza-Unspecified 05/23/2013, 05/23/2015, 06/25/2016  . Pneumococcal Conjugate-13 06/02/2014  . Pneumococcal Polysaccharide-23 07/06/2009, 10/14/2016  . Tdap 10/30/2018  . Zoster 07/25/2014   Screening Tests Health Maintenance  Topic Date Due  . INFLUENZA VACCINE  03/26/2019  . Fecal DNA (Cologuard)  12/05/2020  . TETANUS/TDAP  10/29/2028  . DEXA SCAN  Completed  . Hepatitis C Screening  Completed  . PNA vac Low Risk Adult  Completed  . MAMMOGRAM  Discontinued       Plan:    End of life planning; Advance aging; Advanced directives discussed. Copy of current HCPOA/Living Will requested.    I have personally reviewed and noted the following in the patient's chart:   . Medical and social history . Use of alcohol, tobacco or illicit drugs  . Current medications and supplements . Functional ability and status . Nutritional status . Physical activity . Advanced directives . List of other physicians . Hospitalizations, surgeries, and ER visits in previous 12 months . Vitals . Screenings to include cognitive, depression, and falls . Referrals and appointments  In addition, I have reviewed and discussed with patient certain preventive protocols, quality metrics, and best practice recommendations. A written personalized care plan for preventive services as well as general preventive health recommendations were provided to patient.     Ashok Pall, LPN  1/61/0960    Agree with plan. Rennie Plowman,  NP

## 2019-01-19 ENCOUNTER — Encounter: Payer: Medicare HMO | Admitting: Surgery

## 2019-01-19 ENCOUNTER — Other Ambulatory Visit: Payer: Self-pay

## 2019-01-20 ENCOUNTER — Institutional Professional Consult (permissible substitution): Payer: Medicare HMO | Admitting: Surgery

## 2019-01-20 ENCOUNTER — Encounter: Payer: Self-pay | Admitting: Surgery

## 2019-01-20 VITALS — BP 131/75 | HR 73 | Temp 97.7°F | Resp 16 | Ht 61.25 in | Wt 152.0 lb

## 2019-01-20 DIAGNOSIS — I712 Thoracic aortic aneurysm, without rupture, unspecified: Secondary | ICD-10-CM

## 2019-01-20 NOTE — Progress Notes (Signed)
Cardiothoracic Surgery Consultation  PCP is Arnett, Lyn RecordsMargaret G, FNP Referring Provider is Jason CoopArnett, Lyn RecordsMargaret G, FNP  Chief Complaint  Patient presents with  . 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  This showed an incidental 7 mm sub-solid left lower lobe pulmonary nodule.  She subsequently had a CT scan of the chest for follow-up on 10/14/2018 which showed small bilateral pulmonary nodules which included the unchanged nodules previously seen at the lung bases on her abdominal CT.  There is also a 4.3 cm fusiform ascending aortic aneurysm. She denies any chest or back pain.    There is no family history of aortic valve disease, aortic aneurysm, or connective tissue disorder.  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  She is very active and goes to the gym almost every day for high intensity workouts including weight lifting.  She has lost about 29 pounds over the past year.  Past Medical History:  Diagnosis Date  . Atrophic vaginitis   . Hypertension   . Menopause    After TAH, premarin 2-3 years, now occasional hot flashes  . Overweight(278.02)   . PONV (postoperative nausea and vomiting)   . Sinusitis     Past Surgical History:  Procedure Laterality Date  . KNEE ARTHROSCOPY Left 07/06/14   Dr. Magnus IvanBlackman  torn meniscus   . TONSILLECTOMY AND ADENOIDECTOMY    . TOTAL ABDOMINAL HYSTERECTOMY W/ BILATERAL SALPINGOOPHORECTOMY  1991  . TOTAL KNEE ARTHROPLASTY Right 12/13/2015   Procedure: RIGHT TOTAL KNEE ARTHROPLASTY;  Surgeon: Kathryne Hitchhristopher Y Blackman, MD;  [facility];  Service: Orthopedics;  Laterality: Right;    Family History  Problem Relation Age of Onset  . Ovarian cancer Mother   . Emphysema  Father   . Heart disease Maternal Aunt   . Stroke Maternal Aunt   . Breast cancer Paternal Grandmother        in her 7520's.     Social History Social History   Tobacco Use  . Smoking status: Never Smoker  . Smokeless tobacco: Never Used  Substance Use Topics  . Alcohol use: Yes    Comment: Rarely  . Drug use: No    Current Outpatient Medications  Medication Sig Dispense Refill  . aspirin 81 MG tablet Take 81 mg by mouth daily.    . Multiple Vitamin (MULTIVITAMIN) tablet Take 1 tablet by mouth daily.    Marland Kitchen. OLIVE LEAF EXTRACT PO Take 1 capsule by mouth.     No current facility-administered medications for this visit.     No Known Allergies  Review of Systems  Constitutional: Negative.   HENT: Negative.   Eyes: Negative.   Respiratory: Negative.   Cardiovascular: Negative for chest pain.  Gastrointestinal: Negative.   Endocrine: Negative.   Genitourinary: Negative.   Musculoskeletal: Positive for arthralgias.  Allergic/Immunologic: Negative.   Neurological: Negative.   Hematological: Negative.   Psychiatric/Behavioral: Negative.     BP 131/75 (BP Location: Right Arm, Patient Position: Sitting, Cuff Size: Large)   Pulse 73   Temp 97.7 F (36.5 C) (Skin)   Resp 16   Ht 5' 1.25" (1.556 m)   Wt 152 lb (68.9 kg)   SpO2 97%  Comment: RA  BMI 28.49 kg/m  Physical Exam Constitutional:      Appearance: Normal appearance. She is normal weight.  HENT:     Head: Normocephalic and atraumatic.  Eyes:     Extraocular Movements: Extraocular movements intact.     Conjunctiva/sclera: Conjunctivae normal.     Pupils: Pupils are equal, round, and reactive to light.  Neck:     Musculoskeletal: Normal range of motion and neck supple.     Vascular: No carotid bruit.  Cardiovascular:     Rate and Rhythm: Normal rate and regular rhythm.     Heart sounds: No murmur.  Pulmonary:     Effort: Pulmonary effort is normal.     Breath sounds: Normal breath sounds.  Musculoskeletal:         General: No swelling.  Skin:    General: Skin is warm and dry.  Neurological:     General: No focal deficit present.     Mental Status: She is alert and oriented to person, place, and time.  Psychiatric:        Mood and Affect: Mood normal.        Behavior: Behavior normal.        Thought Content: Thought content normal.        Judgment: Judgment normal.      Diagnostic Tests:  CLINICAL DATA:  7 mm sub solid nodule seen in the left lower lobe on an abdomen and pelvis CT dated 01/27/2018.  EXAM: CT CHEST WITHOUT CONTRAST  TECHNIQUE: Multidetector CT imaging of the chest was performed following the standard protocol without IV contrast.  COMPARISON:  Abdomen and pelvis CT dated 01/27/2018.  FINDINGS: Cardiovascular: Aneurysmal dilatation of the ascending thoracic aorta measuring 4.3 cm in diameter on image number 58 series 2. Normal sized heart.  Mediastinum/Nodes: No enlarged mediastinal or axillary lymph nodes. Thyroid gland, trachea, and esophagus demonstrate no significant findings. A small hiatal hernia is noted.  Lungs/Pleura: 6 mm left upper lobe nodule on image number 24 series 4. 7 mm left lower lobe nodule on image number 98 series 4, unchanged. 5 mm right middle lobe nodule on image number 105 series 4, without significant change. 2 mm left upper lobe nodule on image number 36 series 4. No airspace consolidation or pleural fluid.  Upper Abdomen: Unremarkable.  Musculoskeletal: Thoracic and lower cervical spine degenerative changes. Approximately 30% T5 superior endplate compression deformity, 10% T7 superior endplate compression deformity and 35% T12 inferior endplate compression deformity with mild bony retropulsion. No acute fracture lines are seen. T9 vertebral hemangioma.  IMPRESSION: 1. Small bilateral lung nodules, including unchanged nodules at the lung bases, as described above. Non-contrast chest CT at 6 months is recommended.  If the nodules are stable at time of repeat CT, then future CT at 18-24 months (from today's scan) is considered optional for low-risk patients, but is recommended for high-risk patients. This recommendation follows the consensus statement: Guidelines for Management of Incidental Pulmonary Nodules Detected on CT Images: From the Fleischner Society 2017; Radiology 2017; 284:228-243. 2. 4.3 cm ascending thoracic aortic aneurysm. Recommend annual imaging followup by CTA or MRA. This recommendation follows 2010 ACCF/AHA/AATS/ACR/ASA/SCA/SCAI/SIR/STS/SVM Guidelines for the Diagnosis and Management of Patients with Thoracic Aortic Disease. 2010; 121: Z610-R604. 3. Small hiatal hernia.   Electronically Signed   By: Beckie Salts M.D.   On: 10/14/2018 17:49  Impression:  This 68 year old woman has small bilateral lung nodules which are 6 mm or less and most likely benign given that  they are bilateral and multiple.  She also has a 4.3 cm fusiform ascending aortic aneurysm of unknown duration.  This is well below the 5.5 cm surgical threshold.  She does have a history of hypertension but has been able to come off of her blood pressure medication since she began working out and lost weight.  I stressed the importance of good blood pressure control and preventing further enlargement of her aorta and aortic dissection.  I reviewed the CT images with her and answered all of her questions.  I cautioned her against doing any heavy weight lifting of more than 35 pounds that may result in a Valsalva maneuver and a sudden rise in her blood pressure.  Other than that I think she can continue her vigorous workouts.  I have recommended that she have a repeat CT scan of the chest without contrast in 6 months to follow-up on the bilateral lung nodules as well as the aortic aneurysm.  Plan:  She will return to see me in 6 months with a CT scan of the chest without contrast.  I spent 30 minutes performing this  consultation and > 50% of this time was spent face to face counseling and coordinating the care of this patient's ascending aortic aneurysm and bilateral lung nodules.   Alleen Borne, MD [facility] [phone]

## 2019-01-27 ENCOUNTER — Ambulatory Visit: Payer: Medicare HMO

## 2019-04-28 DIAGNOSIS — R69 Illness, unspecified: Secondary | ICD-10-CM | POA: Diagnosis not present

## 2019-05-11 ENCOUNTER — Encounter: Payer: Self-pay | Admitting: Family

## 2019-05-26 DIAGNOSIS — H524 Presbyopia: Secondary | ICD-10-CM | POA: Diagnosis not present

## 2019-06-30 DIAGNOSIS — H43812 Vitreous degeneration, left eye: Secondary | ICD-10-CM | POA: Diagnosis not present

## 2019-06-30 DIAGNOSIS — H35372 Puckering of macula, left eye: Secondary | ICD-10-CM | POA: Diagnosis not present

## 2019-08-09 ENCOUNTER — Other Ambulatory Visit: Payer: Self-pay | Admitting: *Deleted

## 2019-08-09 DIAGNOSIS — R918 Other nonspecific abnormal finding of lung field: Secondary | ICD-10-CM

## 2019-08-09 DIAGNOSIS — I712 Thoracic aortic aneurysm, without rupture, unspecified: Secondary | ICD-10-CM

## 2019-08-10 ENCOUNTER — Encounter: Payer: Self-pay | Admitting: Family

## 2019-08-11 ENCOUNTER — Other Ambulatory Visit: Payer: Self-pay | Admitting: Family

## 2019-08-11 DIAGNOSIS — I712 Thoracic aortic aneurysm, without rupture, unspecified: Secondary | ICD-10-CM

## 2019-08-25 ENCOUNTER — Encounter: Payer: Self-pay | Admitting: Family

## 2019-08-30 ENCOUNTER — Encounter: Payer: Self-pay | Admitting: Family

## 2019-09-21 ENCOUNTER — Ambulatory Visit
Admission: RE | Admit: 2019-09-21 | Discharge: 2019-09-21 | Disposition: A | Discharge status: Home / Self Care | Payer: Medicare HMO | Source: Ambulatory Visit | Attending: Surgery | Admitting: Surgery

## 2019-09-21 ENCOUNTER — Other Ambulatory Visit: Payer: Self-pay

## 2019-09-21 ENCOUNTER — Encounter: Payer: Self-pay | Admitting: Surgery

## 2019-09-21 ENCOUNTER — Ambulatory Visit: Payer: Medicare HMO | Admitting: Surgery

## 2019-09-21 VITALS — BP 134/86 | HR 68 | Temp 97.7°F | Resp 16 | Ht 61.25 in | Wt 156.0 lb

## 2019-09-21 DIAGNOSIS — I712 Thoracic aortic aneurysm, without rupture, unspecified: Secondary | ICD-10-CM

## 2019-09-21 DIAGNOSIS — R918 Other nonspecific abnormal finding of lung field: Secondary | ICD-10-CM | POA: Diagnosis not present

## 2019-09-21 NOTE — Progress Notes (Signed)
HPI:  The patient returns today for follow-up of small bilateral pulmonary nodules that are most likely benign as well as a 4.3 cm fusiform ascending aortic aneurysm.  Since I last saw her on 01/20/2019 she has had no change in her medical condition.  She feels well without chest or back pain.  She said that she and her husband are getting ready to move to [place] outside of [place] within a few weeks.  Current Outpatient Medications  Medication Sig Dispense Refill  . aspirin 81 MG tablet Take 81 mg by mouth daily.    . Multiple Vitamin (MULTIVITAMIN) tablet Take 1 tablet by mouth daily.    Marland Kitchen OLIVE LEAF EXTRACT PO Take 1 capsule by mouth.     No current facility-administered medications for this visit.     Physical Exam: BP 134/86 (BP Location: Left Arm, Patient Position: Sitting, Cuff Size: Normal)   Pulse 68   Temp 97.7 F (36.5 C)   Resp 16   Ht 5' 1.25" (1.556 m)   Wt 156 lb (70.8 kg)   SpO2 96% Comment: ON RA  BMI 29.24 kg/m  She looks well. There is no cervical or supraclavicular adenopathy. Cardiac exam shows a regular rate and rhythm with normal heart sounds.  There is no murmur. Lungs are clear.   Diagnostic Tests:  CLINICAL DATA:  Follow-up of thoracic aortic aneurysm.  EXAM: CT CHEST WITHOUT CONTRAST  TECHNIQUE: Multidetector CT imaging of the chest was performed following the standard protocol without IV contrast.  COMPARISON:  Chest CT 10/14/2018 and abdominal CT 01/27/2018  FINDINGS: Cardiovascular: Heart is normal in size. Calcified plaque over the left lateral circumflex coronary artery. Very minimal calcified plaque over the distal descending thoracic aorta. Continued evidence of aneurysmal dilatation of the ascending thoracic aorta measuring 4.2 cm in AP diameter and unchanged. Remaining vascular structures are unremarkable.  Mediastinum/Nodes: No mediastinal or hilar adenopathy. Remaining mediastinal structures are  unremarkable.  Lungs/Pleura: Lungs are adequately inflated without consolidation or effusion. Airways are normal. There are a few small scattered bilateral subcentimeter pulmonary nodules unchanged. No new nodules are identified. The largest right lung nodule measures 4-5 mm and is unchanged over the right middle lobe (series 8, image 108). Largest left lung nodule measures 6 mm over the left lower lobe just above the diaphragm (series 8, image 96).  Upper Abdomen: No acute findings. Prominent left extrarenal pelvis unchanged.  Musculoskeletal: Stable T12 compression fracture and stable mild T5 compression fracture.  IMPRESSION: 1. Stable ascending thoracic aortic aneurysm measuring 4.2 cm in AP diameter. Recommend annual imaging followup by CTA or MRA. This recommendation follows 2010 ACCF/AHA/AATS/ACR/ASA/SCA/SCAI/SIR/STS/SVM Guidelines for the Diagnosis and Management of Patients with Thoracic Aortic Disease. Circulation. 2010; 121: W656-C127. Aortic aneurysm NOS (ICD10-I71.9). Aortic aneurysm NOS (ICD10-I71.9).  2. Several bilateral stable subcentimeter pulmonary nodules as described with the largest measuring 6 mm over the left lower lobe. No new nodules. Recommend 1 additional follow-up noncontrast chest CT in 1 year to document 2 years of stability. This recommendation follows the consensus statement: Guidelines for Management of Small Pulmonary Nodules Detected on CT Scans: A Statement from the Fleischner Society as published in Radiology 2005; 237:395-400. Online at: DietDisorder.cz.  3. Aortic Atherosclerosis (ICD10-I70.0). Atherosclerotic coronary artery disease.  4.  Stable T5 and T12 spinal compression fractures.   Electronically Signed   By: Elberta Fortis M.D.   On: 09/21/2019 13:00   Impression:  She has multiple bilateral subcentimeter pulmonary nodules with the  largest measuring 6 mm in the left lower  lobe.  These are unchanged and most likely benign.  She also has a stable 4.2 cm fusiform ascending aortic aneurysm.  I reviewed the CT images with her and answered her questions.  I stressed the importance of continued good blood pressure control in preventing further enlargement and acute aortic dissection.  Radiology feels that she should have a follow-up CT scan of the chest in 1 year to document 2 years of stability of the small pulmonary nodules.  Her aneurysm is still well below the surgical threshold of 5.5 cm but should be followed up in 1 year with a CT scan of the chest.     Plan:  She is moving to [place] in the near future and will establish a primary care provider there who can either continue following these 2 issues I will refer her to a cardiothoracic surgeon there.  I spent 20 minutes performing this established patient evaluation and > 50% of this time was spent face to face counseling and coordinating the care of this patient's aortic aneurysm and bilateral pulmonary nodules.   Gaye Pollack, MD [facility] [phone]

## 2019-12-21 ENCOUNTER — Ambulatory Visit: Payer: Medicare HMO

## 2020-04-12 ENCOUNTER — Telehealth: Payer: Self-pay | Admitting: Orthopaedic Surgery

## 2020-04-12 NOTE — Telephone Encounter (Signed)
Patient called, has moved to [place] and needs records. Emailing records release form to her, per her request. She will mail it back.

## 2020-05-02 ENCOUNTER — Telehealth: Payer: Self-pay | Admitting: Orthopaedic Surgery

## 2020-05-02 NOTE — Telephone Encounter (Signed)
Received call from pt, needs 07/06/2014 knee op note faxed [phone]. I faxed (auth on file)

## 2020-09-16 IMAGING — CT CT CHEST W/O CM
2 of 4 series · 11 of 36 positions shown, 13 images · non-contrast
Comparison: Chest CT 10/14/2018 and abdominal CT 01/27/2018

CLINICAL DATA: Follow-up of thoracic aortic aneurysm.

EXAM:
CT CHEST WITHOUT CONTRAST
TECHNIQUE: Multidetector CT imaging of the chest was performed following the
standard protocol without IV contrast.

[Series 2: chest 2.00 br40 s3 · axial · 0.54mm/px · z∈[+1390,+1628]mm · 8 of 139 slices shown, 10 images (1 of 2)]
[im 10/139  mediastinal]
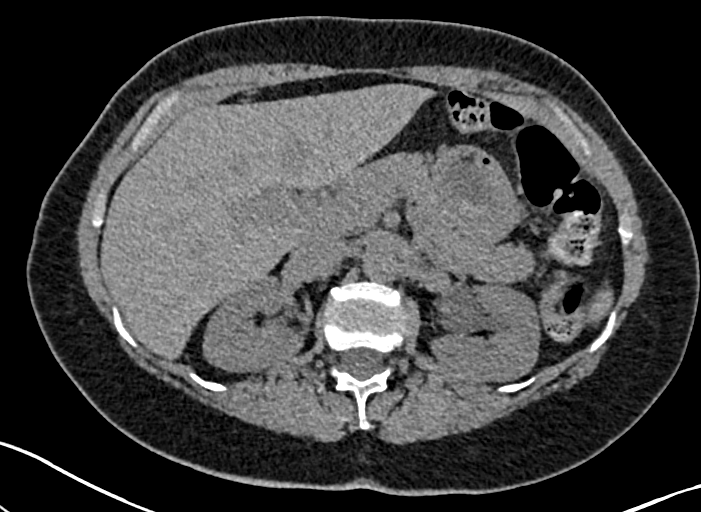
[im 10/139  lung]
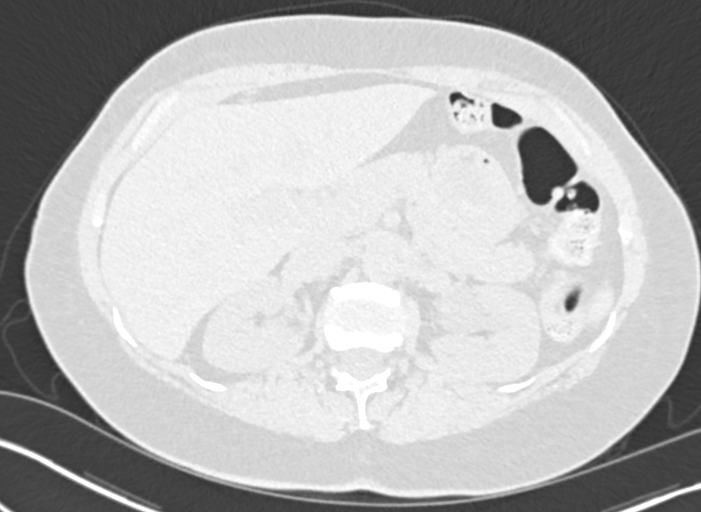
[im 30/139  lung]
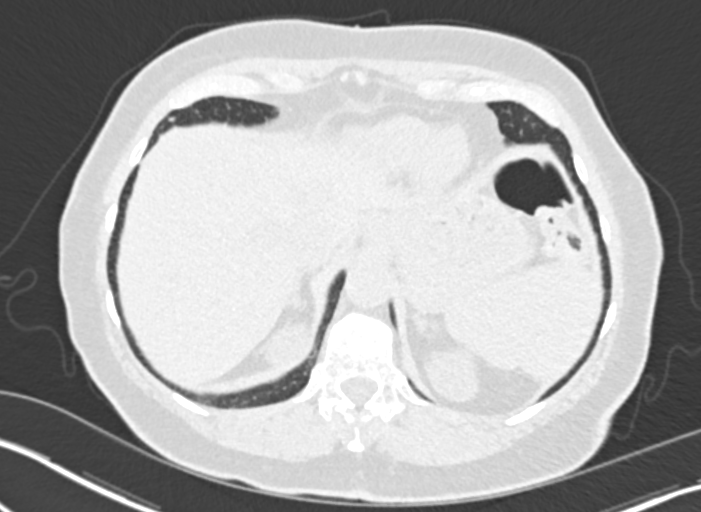
[im 50/139  lung]
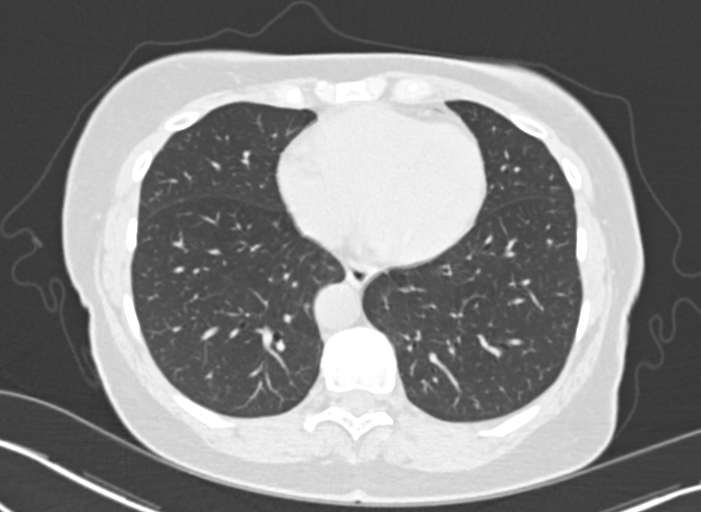
[im 60/139  lung]
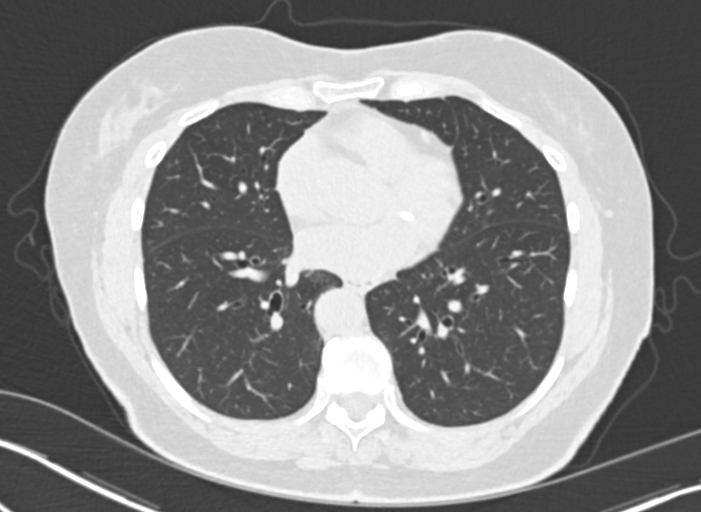
[im 79/139  mediastinal]
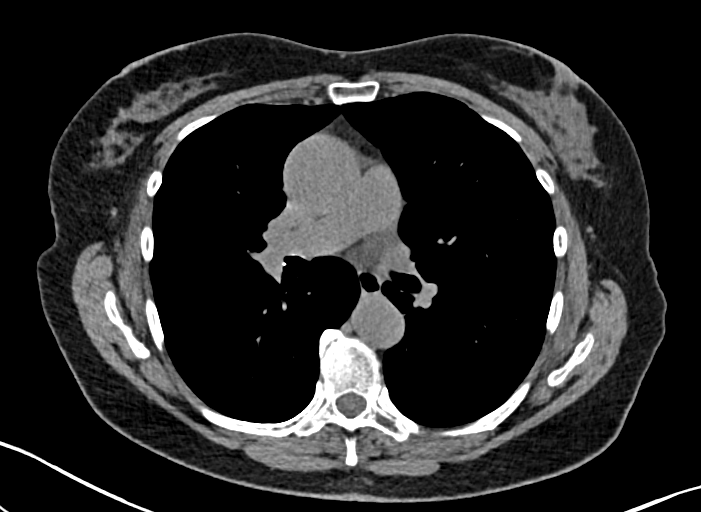
[im 79/139  lung]
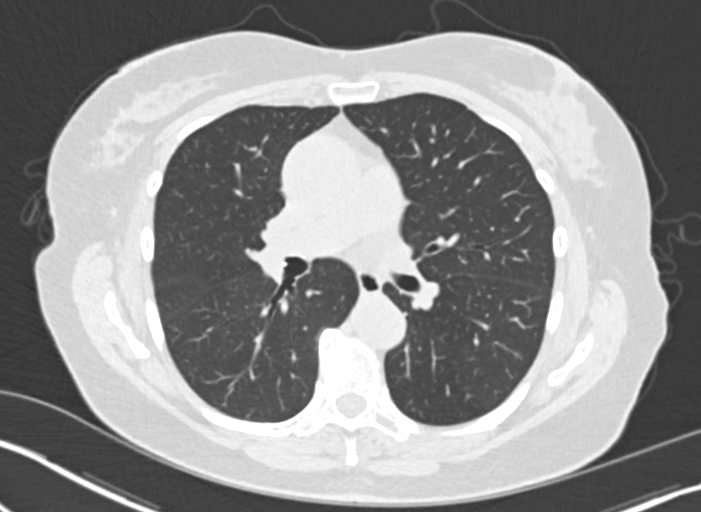
[im 89/139  lung]
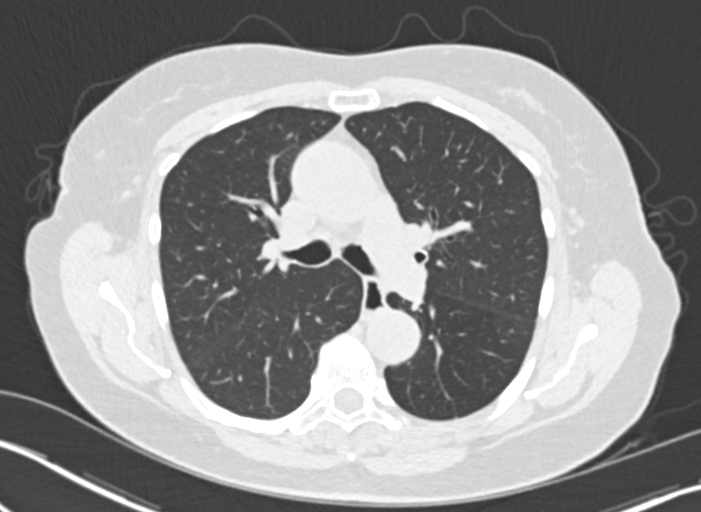
[im 109/139  lung]
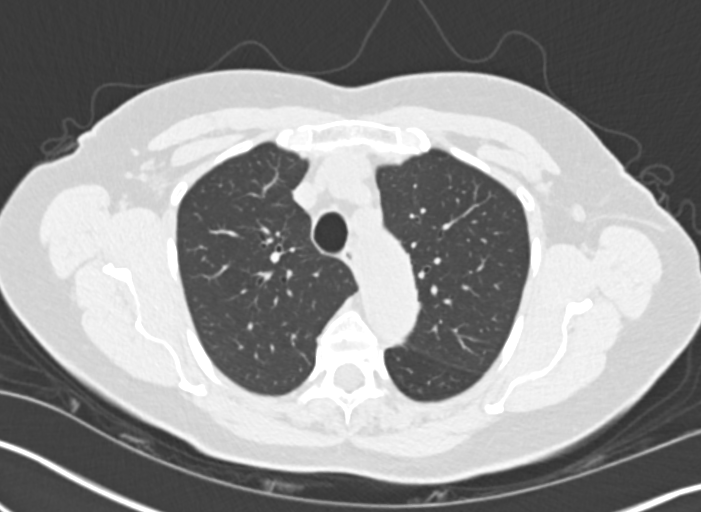
[im 129/139  lung]
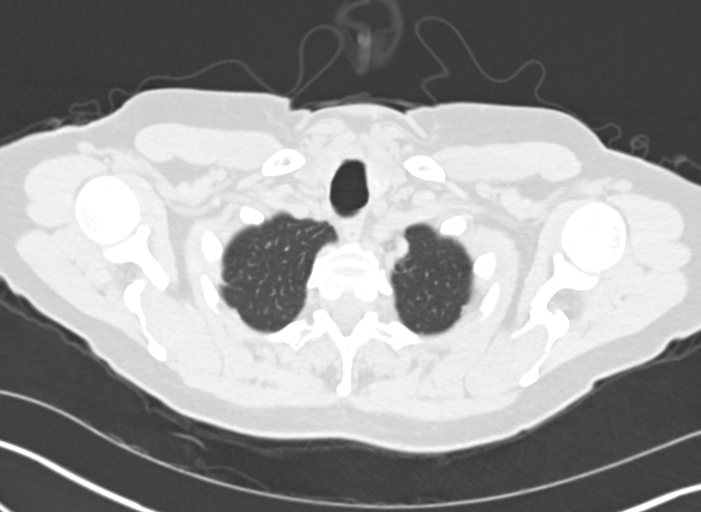

[Series 4: chest 2.00 br40 s3 · coronal · 0.55mm/px · 3 of 137 slices shown (2 of 2)]
[im 28/137  lung]
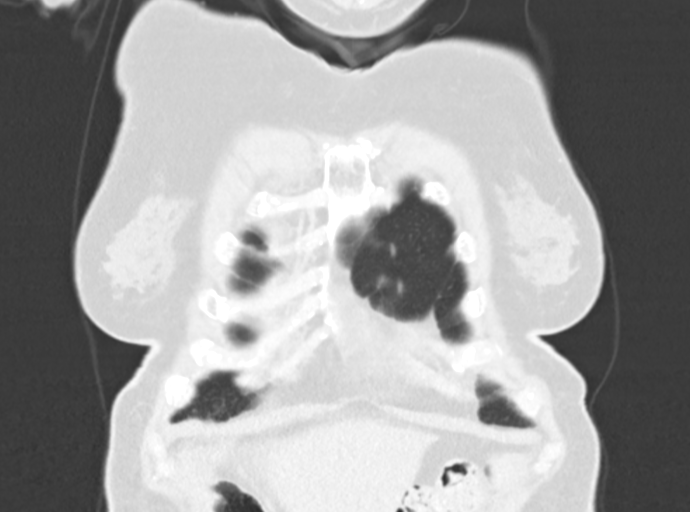
[im 55/137  lung]
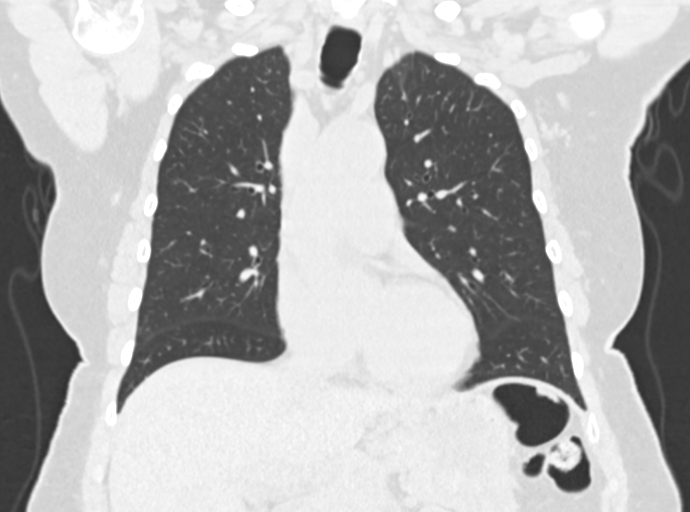
[im 82/137  lung]
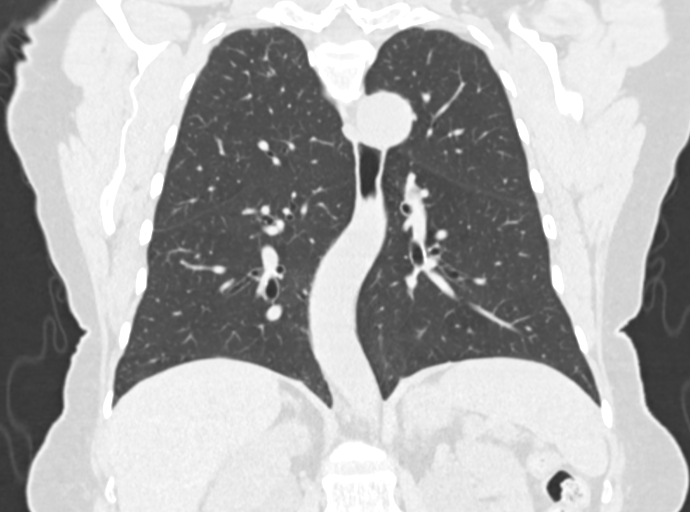

[11 of 36 positions shown; findings below may reference images not displayed]

FINDINGS: Cardiovascular: Heart is normal in size. Calcified plaque over the
left lateral circumflex coronary artery. Very minimal calcified
plaque over the distal descending thoracic aorta. Continued evidence
of aneurysmal dilatation of the ascending thoracic aorta measuring
4.2 cm in AP diameter and unchanged. Remaining vascular structures
are unremarkable.

Mediastinum/Nodes: No mediastinal or hilar adenopathy. Remaining
mediastinal structures are unremarkable.

Lungs/Pleura: Lungs are adequately inflated without consolidation or
effusion. Airways are normal. There are a few small scattered
bilateral subcentimeter pulmonary nodules unchanged. No new nodules
are identified. The largest right lung nodule measures 4-5 mm and is
unchanged over the right middle lobe (series 8, image 108). Largest
left lung nodule measures 6 mm over the left lower lobe just above
the diaphragm (series 8, image 96).

Upper Abdomen: No acute findings. Prominent left extrarenal pelvis
unchanged.

Musculoskeletal: Stable T12 compression fracture and stable mild T5
compression fracture.
IMPRESSION: 1. Stable ascending thoracic aortic aneurysm measuring 4.2 cm in AP
diameter. Recommend annual imaging followup by CTA or MRA. This
recommendation follows 5494
ACCF/AHA/AATS/ACR/ASA/SCA/MOORHEAD/DREY/BERINI/EBADAT Guidelines for the
Diagnosis and Management of Patients with Thoracic Aortic Disease.
Circulation. 5494; 121: E266-e369. Aortic aneurysm NOS
(DLLXF-6OC.D). Aortic aneurysm NOS (DLLXF-6OC.D).

2. Several bilateral stable subcentimeter pulmonary nodules as
described with the largest measuring 6 mm over the left lower lobe.
No new nodules. Recommend 1 additional follow-up noncontrast chest
CT in 1 year to document 2 years of stability. This recommendation
follows the consensus statement: Guidelines for Management of Small
Pulmonary Nodules Detected on CT Scans: A Statement from the
Online at: [URL]

3. Aortic Atherosclerosis (DLLXF-319.9). Atherosclerotic coronary
artery disease.

4.  Stable T5 and T12 spinal compression fractures.
